# Patient Record
Sex: Male | Born: 1980 | Race: White | Hispanic: No | Smoking: Current every day smoker
Health system: Southern US, Community
[De-identification: ages and names within clinical notes are randomized; demographics above are authoritative.]

## PROBLEM LIST (undated history)

## (undated) DIAGNOSIS — E119 Type 2 diabetes mellitus without complications: Secondary | ICD-10-CM

## (undated) DIAGNOSIS — F32A Depression, unspecified: Secondary | ICD-10-CM

## (undated) DIAGNOSIS — F329 Major depressive disorder, single episode, unspecified: Secondary | ICD-10-CM

## (undated) DIAGNOSIS — E785 Hyperlipidemia, unspecified: Secondary | ICD-10-CM

## (undated) HISTORY — DX: Depression, unspecified: F32.A

## (undated) HISTORY — DX: Major depressive disorder, single episode, unspecified: F32.9

---

## 2005-08-17 ENCOUNTER — Emergency Department: Payer: Self-pay | Admitting: Unknown Physician Specialty

## 2011-07-20 ENCOUNTER — Emergency Department: Payer: Self-pay | Admitting: Emergency Medicine

## 2012-10-11 IMAGING — CR DG CHEST 2V
1 series · 2 of 2 positions shown · non-contrast
Comparison: none

REASON FOR EXAM: chest pain
COMMENTS:   May transport without cardiac monitor

PROCEDURE:     DXR - DXR CHEST PA (OR AP) AND LATERAL  - July 20, 2011  [DATE]
RESULT:     The lung fields are clear. No pneumonia, pneumothorax or pleural
effusion is seen. The heart, mediastinal and osseous structures show no
acute changes.

[Series 1: w chest pa · 0.14mm/px · 2 of 2 slices shown]
[im 1/2]
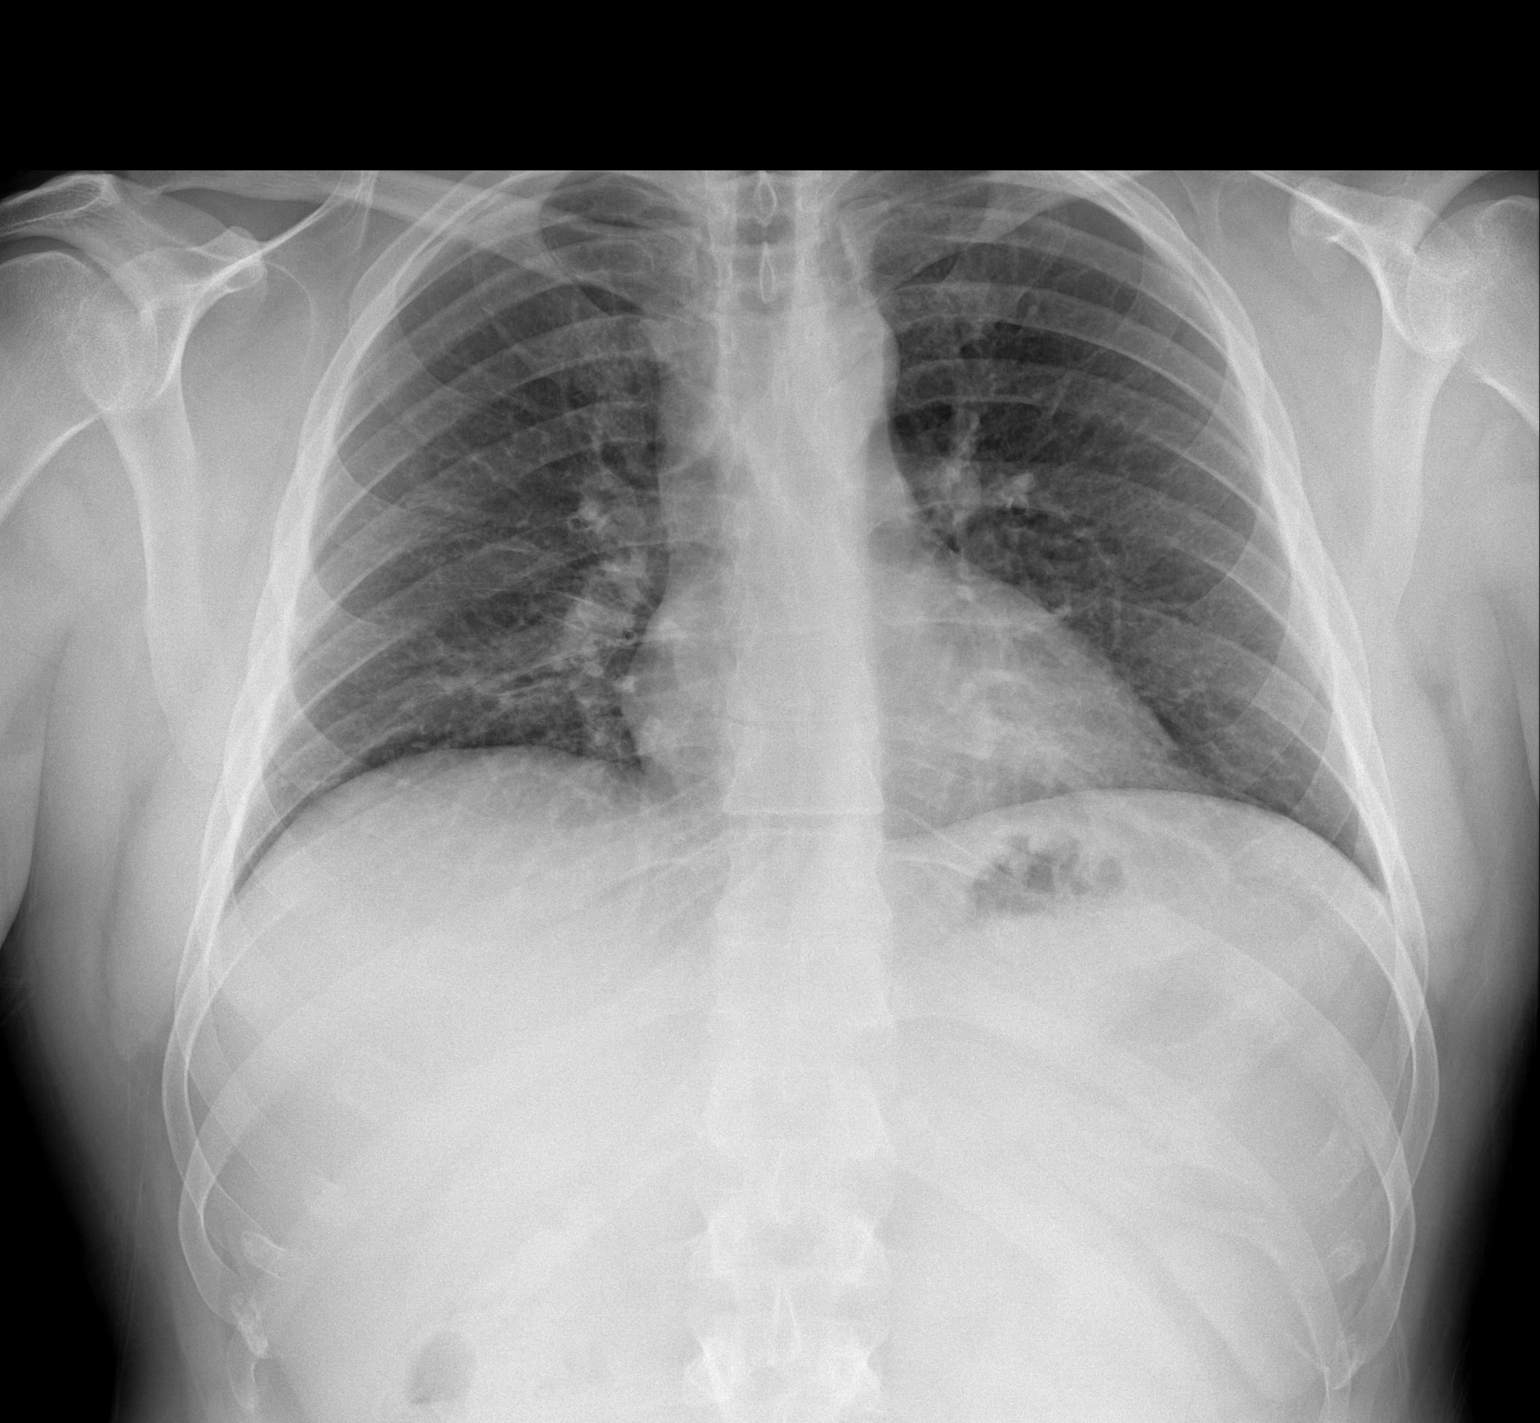
[im 2/2]
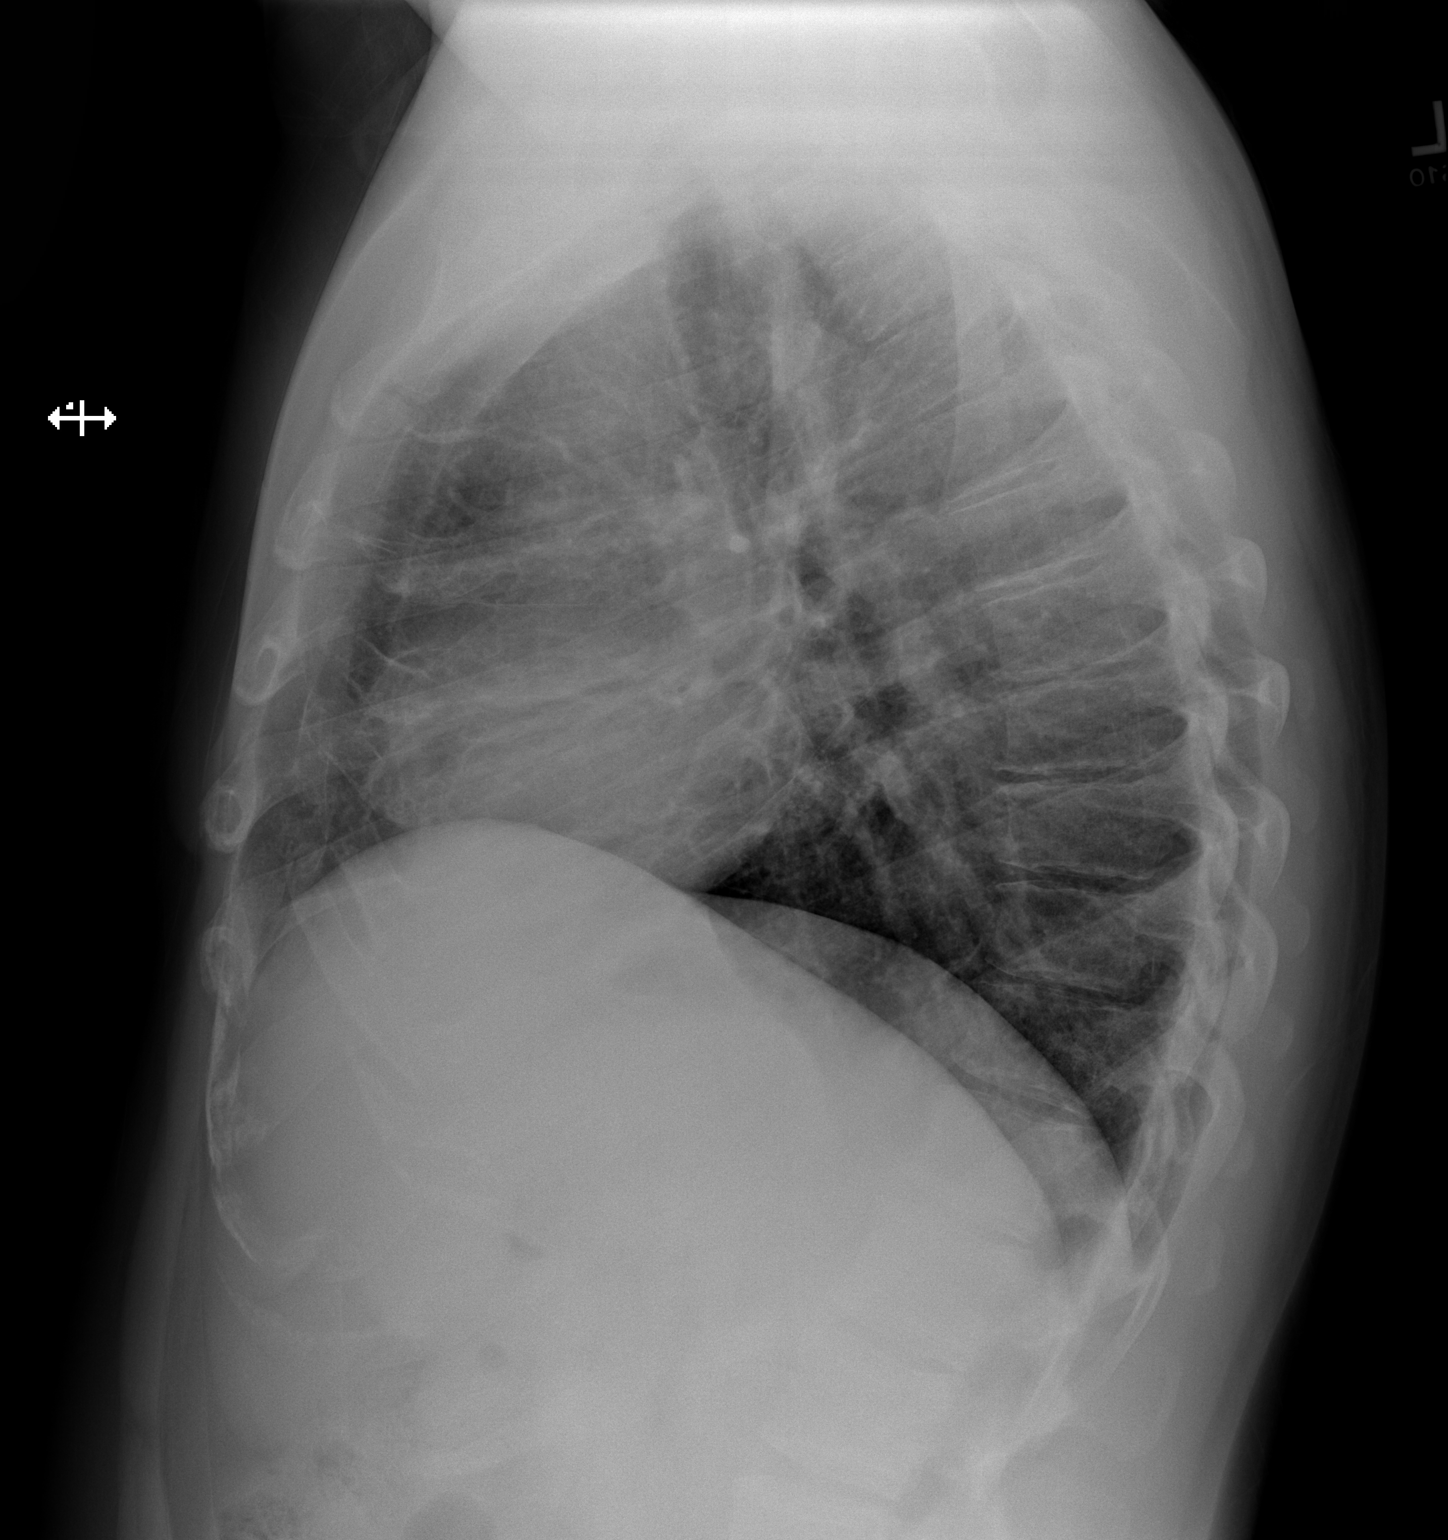

[2 of 2 positions shown; findings below may reference images not displayed]

IMPRESSION: 1.     No acute changes are identified.

## 2012-10-11 IMAGING — CT CT CHEST W/ CM
1 of 2 series · 15 of 32 positions shown, 20 images · IV contrast (isovue)
Comparison: none

REASON FOR EXAM: query PE
COMMENTS:

PROCEDURE:     CT  - CT CHEST (FOR PE) W  - July 20, 2011  [DATE]
RESULT:     Chest CT dated 07/20/2011.
TECHNIQUE: Helical 3 mm sections were obtained from the thoracic inlet
through the lung bases status post intravenous administration of 75 mL of
Isovue-HVM.

[Series 4: soft tissue · axial · 0.69mm/px · z∈[+208,+464]mm · 15 of 97 slices shown, 20 images]
[im 6/97  soft-tissue]
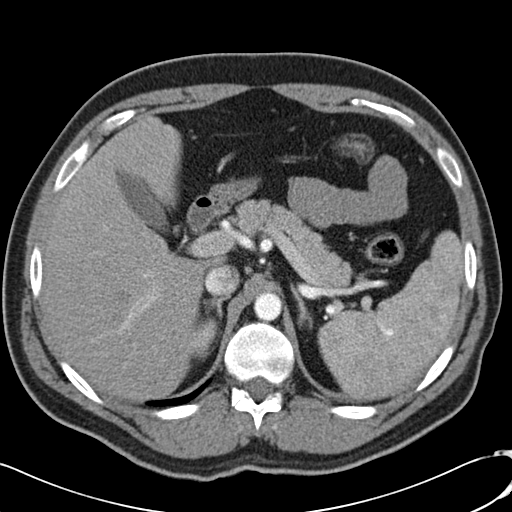
[im 6/97  bone]
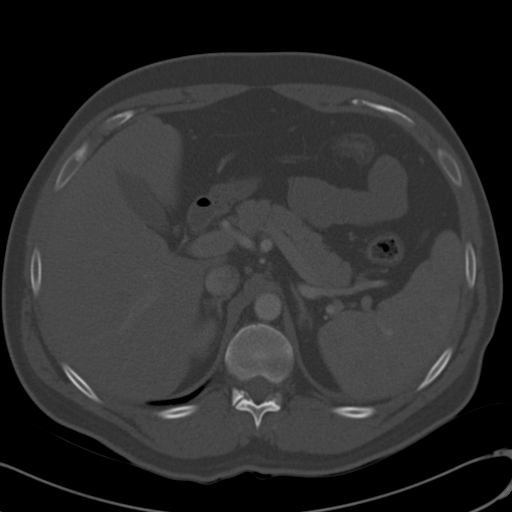
[im 11/97  soft-tissue]
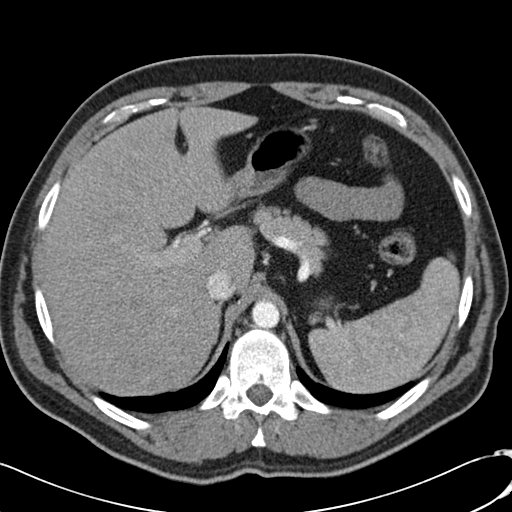
[im 21/97  soft-tissue]
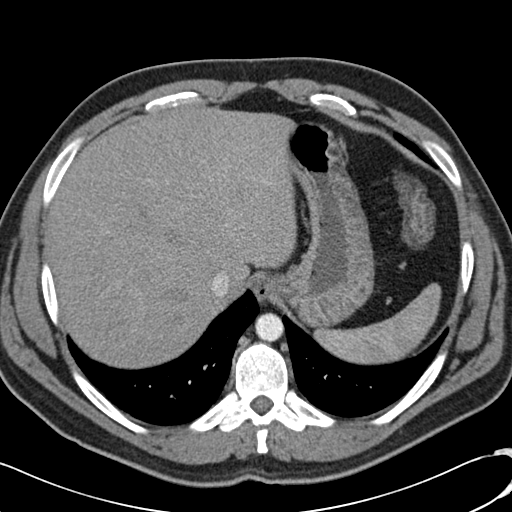
[im 26/97  soft-tissue]
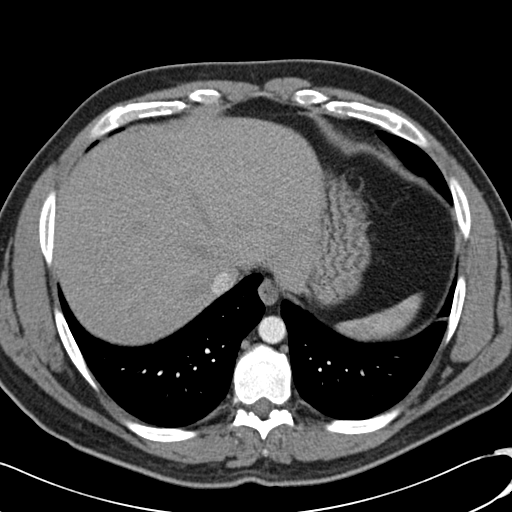
[im 31/97  soft-tissue]
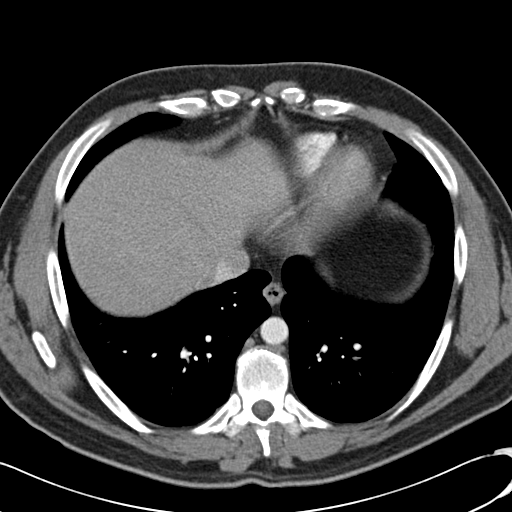
[im 41/97  soft-tissue]
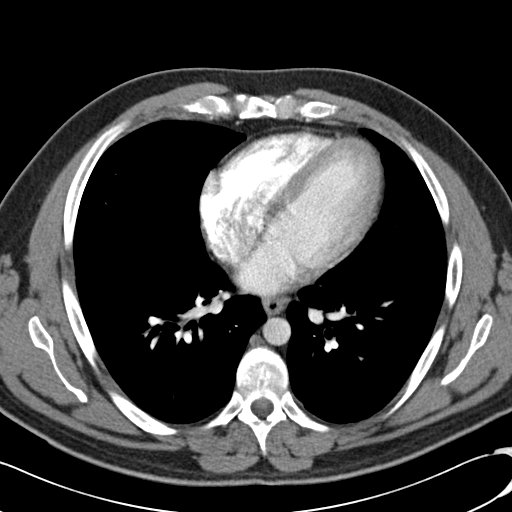
[im 46/97  soft-tissue]
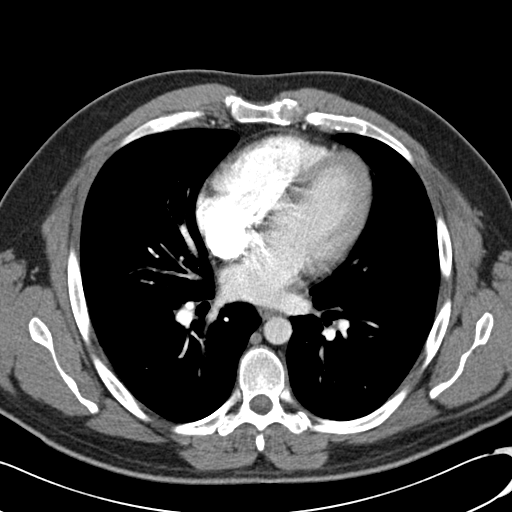
[im 51/97  soft-tissue]
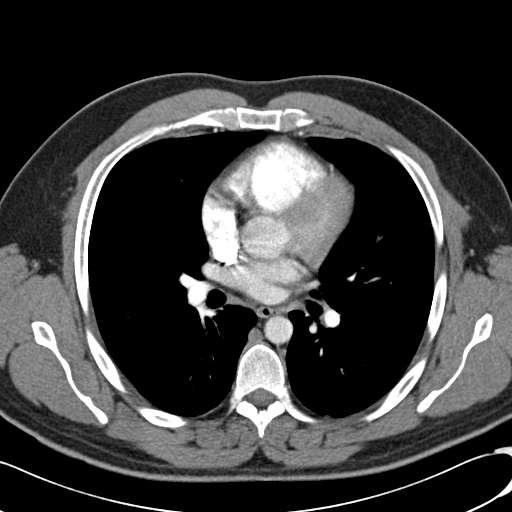
[im 56/97  soft-tissue]
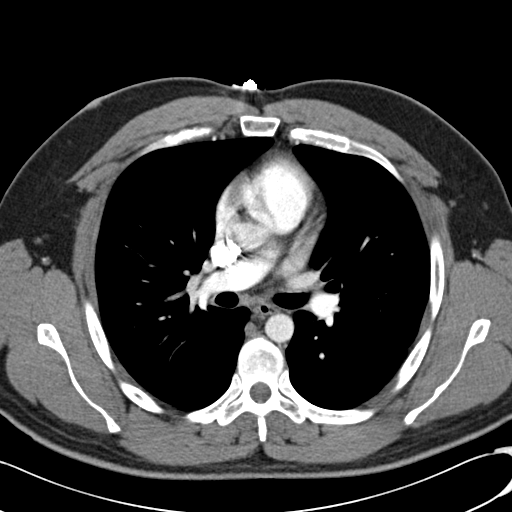
[im 56/97  bone]
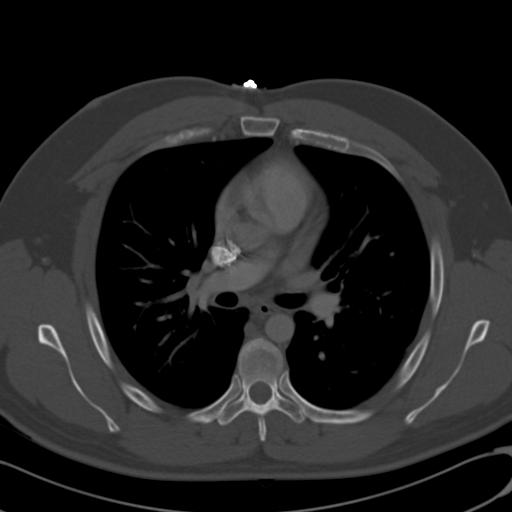
[im 66/97  soft-tissue]
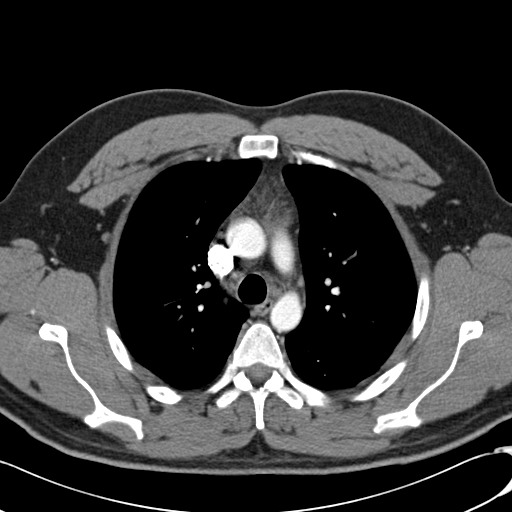
[im 71/97  soft-tissue]
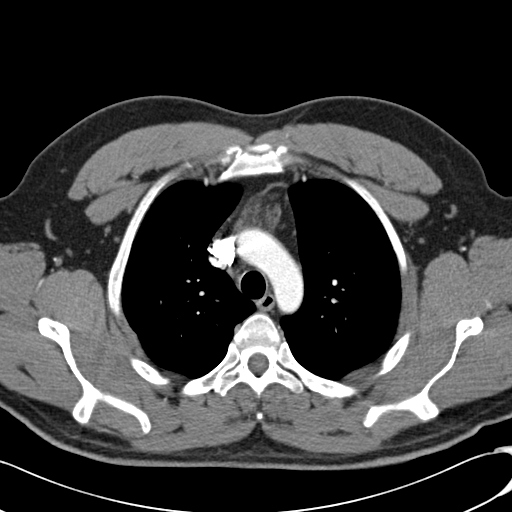
[im 76/97  soft-tissue]
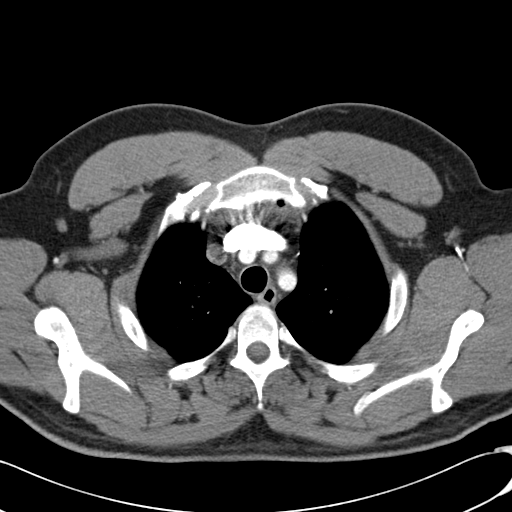
[im 76/97  lung]
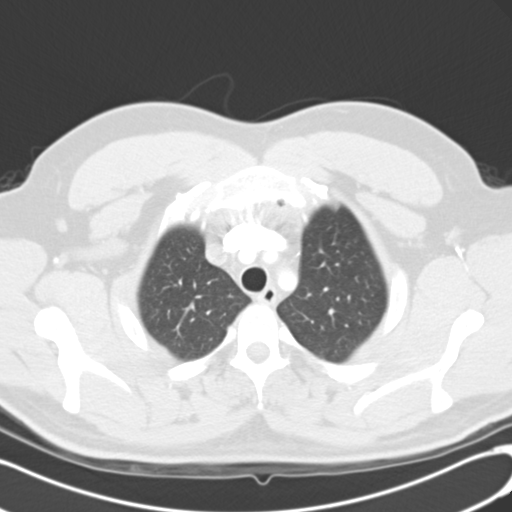
[im 81/97  lung]
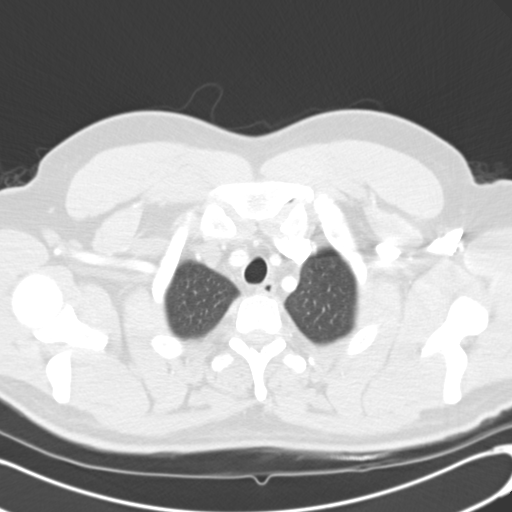
[im 86/97  soft-tissue]
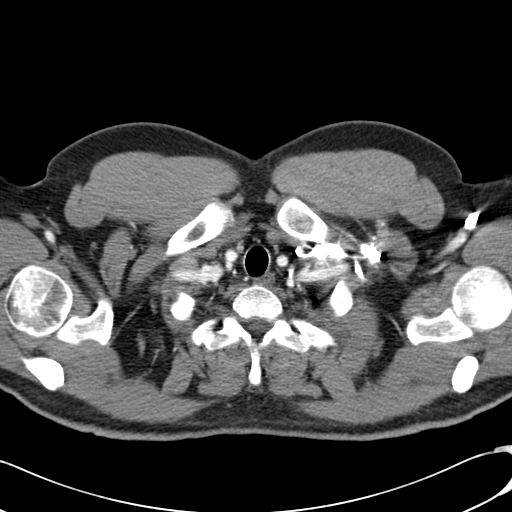
[im 86/97  lung]
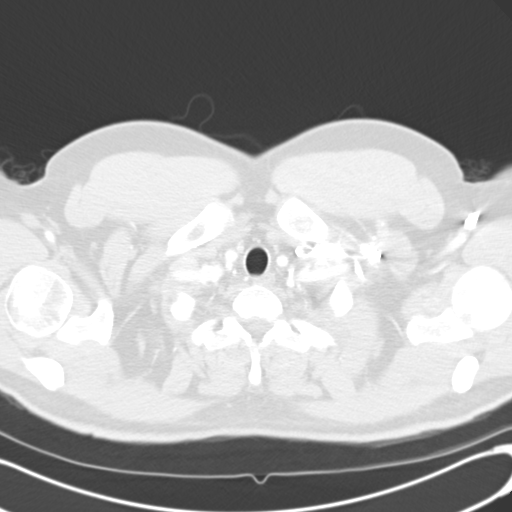
[im 91/97  soft-tissue]
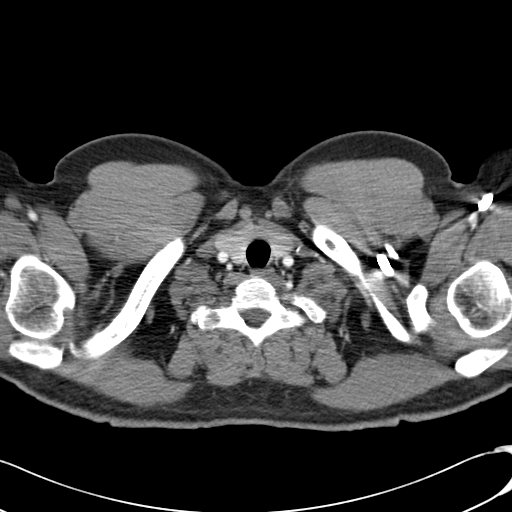
[im 91/97  lung]
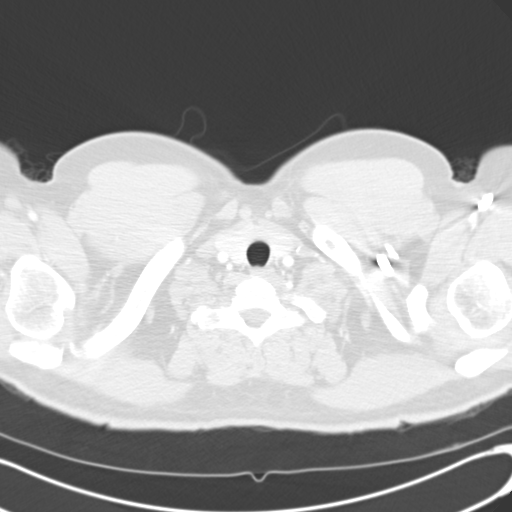

[15 of 32 positions shown; findings below may reference images not displayed]

FINDINGS: Mediastinum and hilar regions and structures demonstrate no
evidence of adenopathy nor masses. There is no evidence of a filling defect
within the main, lobar, or segmental pulmonary arteries. The lung parenchyma
demonstrate no evidence of focal infiltrates, effusions, edema, masses, nor
nodules. The visualized upper viscera are unremarkable.
IMPRESSION: No CT evidence of pulmonary to embolic disease.
2. The remaining findings as described above.

## 2013-09-06 HISTORY — PX: VASECTOMY: SHX75

## 2014-04-12 ENCOUNTER — Ambulatory Visit (INDEPENDENT_AMBULATORY_CARE_PROVIDER_SITE_OTHER): Payer: BC Managed Care – PPO | Admitting: Adult Health

## 2014-04-12 ENCOUNTER — Encounter: Payer: Self-pay | Admitting: Adult Health

## 2014-04-12 ENCOUNTER — Encounter (INDEPENDENT_AMBULATORY_CARE_PROVIDER_SITE_OTHER): Payer: Self-pay

## 2014-04-12 VITALS — BP 114/75 | HR 84 | Temp 98.2°F | Resp 14 | Ht 69.75 in | Wt 185.0 lb

## 2014-04-12 DIAGNOSIS — Z7189 Other specified counseling: Secondary | ICD-10-CM

## 2014-04-12 DIAGNOSIS — Z716 Tobacco abuse counseling: Secondary | ICD-10-CM | POA: Insufficient documentation

## 2014-04-12 DIAGNOSIS — F17200 Nicotine dependence, unspecified, uncomplicated: Secondary | ICD-10-CM

## 2014-04-12 DIAGNOSIS — F411 Generalized anxiety disorder: Secondary | ICD-10-CM

## 2014-04-12 MED ORDER — SERTRALINE HCL 25 MG PO TABS: 25.0000 mg | ORAL_TABLET | Freq: Every day | ORAL | Status: DC

## 2014-04-12 NOTE — Progress Notes (Signed)
Pre visit review using our clinic review tool, if applicable. No additional management support is needed unless otherwise documented below in the visit note. 

## 2014-04-12 NOTE — Progress Notes (Signed)
Patient ID: Tony Lynn, male   DOB: [birth year], 33 y.o.   MRN: [id number]    Subjective:    Patient ID: Tony Lynn, male    DOB: [birth year], 33 y.o.   MRN: [id number]  HPI  Pt is a pleasant 33 y/o male who presents to clinic to establish care. He reports history of feeling depressed during his divorce 1.5 years ago. No problems of depression otherwise. He reports that he worries all the time. Worries about things he cannot control and about the future. Would like to try some medication that may help with this ongoing "anxiety" about life. Has tried xanax and did not like the way it made him feel.  He is a smoker since age 33. Smokes ~ 1 ppd. He has tried Chantix but it made him have terrible dreams so he stopped taking it. He would like to quit.   Past Medical History  Diagnosis Date  . Depression     Situational - divorce    Past Surgical History  Procedure Laterality Date  . Vasectomy  Jan. 2015    Family History  Problem Relation Age of Onset  . Arthritis Mother   . Lupus Mother   . Depression Mother     anxiety/depression  . Heart disease Mother   . Hypertension Father   . Arthritis Maternal Grandfather   . Cancer Maternal Grandfather   . Diabetes Paternal Grandfather     History   Social History  . Marital Status: Divorced    Spouse Name: N/A    Number of Children: 2  . Years of Education: N/A   Occupational History  . [employer]    Social History Main Topics  . Smoking status: Current Every Day Smoker -- 1.00 packs/day for 15 years    Types: Cigarettes  . Smokeless tobacco: Never Used  . Alcohol Use: Yes     Comment: 6-8 drinks weekly  . Drug Use: No  . Sexual Activity: Not on file   Other Topics Concern  . Not on file   Social History Narrative  . No narrative on file     Review of Systems  Constitutional: Negative.   HENT: Negative.   Eyes: Negative.   Respiratory: Negative.   Cardiovascular: Negative.     Gastrointestinal: Negative.   Endocrine: Negative.   Genitourinary: Negative.   Musculoskeletal: Negative.   Skin: Negative.   Allergic/Immunologic: Negative.   Neurological: Negative.   Hematological: Negative.   Psychiatric/Behavioral: Negative.        Objective:  There were no vitals taken for this visit.   Physical Exam  Constitutional: He is oriented to person, place, and time. He appears well-developed and well-nourished. No distress.  HENT:  Head: Normocephalic and atraumatic.  Eyes: Conjunctivae and EOM are normal.  Neck: Neck supple.  Cardiovascular: Normal rate and regular rhythm.   Pulmonary/Chest: Effort normal. No respiratory distress.  Musculoskeletal: Normal range of motion.  Neurological: He is alert and oriented to person, place, and time. Coordination normal.  Skin: Skin is warm and dry.  Psychiatric: He has a normal mood and affect. His behavior is normal. Judgment and thought content normal.       Assessment & Plan:   1. Generalized anxiety disorder Start zoloft 25 mg daily. Discussed side effects of medication. He will return for follow up in 1 month.  2. Tobacco abuse counseling Encouraged him to quit. He is going to try the nicotine patches.

## 2014-04-12 NOTE — Patient Instructions (Signed)
  Start Zoloft 25 mg daily.    Return for a follow up appt in 1 month.

## 2014-04-15 ENCOUNTER — Telehealth: Payer: Self-pay | Admitting: Adult Health

## 2014-04-15 NOTE — Telephone Encounter (Signed)
Relevant patient education assigned to patient using Emmi. ° °

## 2014-06-18 ENCOUNTER — Encounter: Payer: Self-pay | Admitting: Internal Medicine

## 2014-06-18 ENCOUNTER — Ambulatory Visit (INDEPENDENT_AMBULATORY_CARE_PROVIDER_SITE_OTHER): Payer: BC Managed Care – PPO | Admitting: Internal Medicine

## 2014-06-18 VITALS — BP 122/70 | HR 85 | Temp 98.0°F | Wt 190.8 lb

## 2014-06-18 DIAGNOSIS — G5621 Lesion of ulnar nerve, right upper limb: Secondary | ICD-10-CM

## 2014-06-18 DIAGNOSIS — M7121 Synovial cyst of popliteal space [Baker], right knee: Secondary | ICD-10-CM

## 2014-06-18 NOTE — Patient Instructions (Signed)
Baker Cyst °A Baker cyst is a sac-like structure that forms in the back of the knee. It is filled with the same fluid that is located in your knee. This fluid lubricates the bones and cartilage of the knee and allows them to move over each other more easily. °CAUSES  °When the knee becomes injured or inflamed, increased fluid forms in the knee. When this happens, the joint lining is pushed out behind the knee and forms the Baker cyst. This cyst may also be caused by inflammation from arthritic conditions and infections. °SIGNS AND SYMPTOMS  °A Baker cyst usually has no symptoms. When the cyst is substantially enlarged: °· You may feel pressure behind the knee, stiffness in the knee, or a mass in the area behind the knee. °· You may develop pain, redness, and swelling in the calf.  This can suggest a blood clot and requires evaluation by your health care provider. °DIAGNOSIS  °A Baker cyst is most often found during an ultrasound exam. This exam may have been performed for other reasons, and the cyst was found incidentally. Sometimes an MRI is used. This picks up other problems within a joint that an ultrasound exam may not. If the Baker cyst developed immediately after an injury, X-ray exams may be used to diagnose the cyst. °TREATMENT  °The treatment depends on the cause of the cyst. Anti-inflammatory medicines and rest often will be prescribed. If the cyst is caused by a bacterial infection, antibiotic medicines may be prescribed.  °HOME CARE INSTRUCTIONS  °· If the cyst was caused by an injury, for the first 24 hours, keep the injured leg elevated on 2 pillows while lying down. °· For the first 24 hours while you are awake, apply ice to the injured area: °¨ Put ice in a plastic bag. °¨ Place a towel between your skin and the bag. °¨ Leave the ice on for 20 minutes, 2-3 times a day. °· Only take over-the-counter or prescription medicines for pain, discomfort, or fever as directed by your health care  provider. °· Only take antibiotic medicine as directed. Make sure to finish it even if you start to feel better. °MAKE SURE YOU:  °· Understand these instructions. °· Will watch your condition. °· Will get help right away if you are not doing well or get worse. °Document Released: 08/23/2005 Document Revised: 06/13/2013 Document Reviewed: 04/04/2013 °ExitCare® Patient Information ©2015 [employer]. This information is not intended to replace advice given to you by your health care provider. Make sure you discuss any questions you have with your health care provider. ° °

## 2014-06-18 NOTE — Progress Notes (Signed)
   Subjective:    Patient ID: Tony Lynn, male    DOB: May 13, 1981, 33 y.o.   MRN: [id number]  HPI  Pt presents to the clinic today with c/o  "knot on the back of my right knee". He noticed this 1 month ago. He reports that he does feel sharp pains in that area. He denies any specific injury to the area. He has not tried anything OTC.  Review of Systems      Past Medical History  Diagnosis Date  . Depression     Situational - divorce    Current Outpatient Prescriptions  Medication Sig Dispense Refill  . sertraline (ZOLOFT) 25 MG tablet Take 1 tablet (25 mg total) by mouth daily.  30 tablet  5   No current facility-administered medications for this visit.    No Known Allergies  Family History  Problem Relation Age of Onset  . Arthritis Mother   . Lupus Mother   . Depression Mother     anxiety/depression  . Heart disease Mother   . Hypertension Father   . Arthritis Maternal Grandfather   . Cancer Maternal Grandfather   . Diabetes Paternal Grandfather     History   Social History  . Marital Status: Divorced    Spouse Name: N/A    Number of Children: 2  . Years of Education: N/A   Occupational History  . [employer]    Social History Main Topics  . Smoking status: Current Every Day Smoker -- 1.00 packs/day for 15 years    Types: Cigarettes  . Smokeless tobacco: Never Used  . Alcohol Use: Yes     Comment: 6-8 drinks weekly  . Drug Use: No  . Sexual Activity: Not on file   Other Topics Concern  . Not on file   Social History Narrative  . No narrative on file     Constitutional: Denies fever, malaise, fatigue, headache or abrupt weight changes.  Musculoskeletal: Pt reports a "knot" on the back of right knee. Denies decrease in range of motion, difficulty with gait, muscle pain or joint pain and swelling.  Skin: Denies redness, rashes, lesions or ulcercations.    No other specific complaints in a complete review of systems (except as listed  in HPI above).  Objective:   Physical Exam  BP 122/70  Pulse 85  Temp(Src) 98 F (36.7 C) (Oral)  Wt 190 lb 12 oz (86.524 kg)  SpO2 97% Wt Readings from Last 3 Encounters:  06/18/14 190 lb 12 oz (86.524 kg)  04/12/14 185 lb (83.915 kg)    General: Appears his stated age, well developed, well nourished in NAD. Skin: Warm, dry and intact. No rashes, lesions or ulcerations noted. Cardiovascular: Normal rate and rhythm. S1,S2 noted.  No murmur, rubs or gallops noted. Pulmonary/Chest: Normal effort and positive vesicular breath sounds. No respiratory distress. No wheezes, rales or ronchi noted. . Musculoskeletal: Normal flexion and extension of the knee. Small < 2 cm palpable mass noted in the right popliteal region. No difficulty with gait.        Assessment & Plan:   Popliteal mass:  ? Bakers cyst but not positive based on exam Will check ultrasound  Will call you with results/treatment plan based off ultrasound  RTC as needed

## 2014-06-18 NOTE — Addendum Note (Signed)
Addended by: Lorre MunroeBAITY, REGINA W on: 06/18/2014 02:42 PM   Modules accepted: Orders

## 2014-06-18 NOTE — Progress Notes (Signed)
Pre visit review using our clinic review tool, if applicable. No additional management support is needed unless otherwise documented below in the visit note. 

## 2014-06-19 ENCOUNTER — Ambulatory Visit: Payer: Self-pay | Admitting: Internal Medicine

## 2014-06-20 ENCOUNTER — Encounter: Payer: Self-pay | Admitting: Internal Medicine

## 2014-06-24 ENCOUNTER — Telehealth: Payer: Self-pay | Admitting: Family Medicine

## 2014-06-24 NOTE — Telephone Encounter (Addendum)
I gave results of x-ray to patient. Tony KocherRegina mentioned in her note she spoke to Dr.Copland about patient's report.  Tony KocherRegina is off this week and patient said he would like to be referred to a surgeon in [place] for Baker's Cyst.  Dr.Copland will you refer patient since Tony KocherRegina is off?

## 2014-06-24 NOTE — Telephone Encounter (Signed)
i am not clear that Rene KocherRegina understood our conversation about this problem. Baker's cysts are not usually operatively fixed.   I would typically not refer this to surgery, since at this point I do not know if it is a surgical problem.   Thanks.

## 2014-06-26 ENCOUNTER — Telehealth: Payer: Self-pay | Admitting: Adult Health

## 2014-06-26 NOTE — Telephone Encounter (Signed)
° °   Small Medium Large Extra Extra Large                Ezekiel InaMichael C Sunday Description: 33 year old male  06/24/2014 Telephone Provider: Hannah BeatSpencer Copland, MD  MRN: [id number] [facility]                 Call Documentation     Hannah BeatSpencer Copland, MD at 06/24/2014 1:22 PM     Status: Signed        i am not clear that Rene KocherRegina understood our conversation about this problem. Baker's cysts are not usually operatively fixed.  I would typically not refer this to surgery, since at this point I do not know if it is a surgical problem.  Thanks.        Laury Axonarrie L Efird at 06/24/2014 9:18 AM     Status: Addendum        I gave results of x-ray to patient. Rene KocherRegina mentioned in her note she spoke to Dr.Copland about patient's report. Rene KocherRegina is off this week and patient said he would like to be referred to a surgeon in [place] for Baker's Cyst. Dr.Copland will you refer patient since Rene KocherRegina is off?                                        Called and spoke with patient stated that Nicki Reaperegina Baity, who saw pt for an acute visit was not in the office this week.  After sending the phone note to another provider, he did not feel comfortable referring to a general surgeon as the patient had requested.  Pt stated he believed he was wasting his time with the US if was not going to be treated.

## 2014-06-26 NOTE — Telephone Encounter (Signed)
Spoke with Mr. Tony Lynn.  Appointment scheduled to see Dr. Patsy Lageropland 07/01/2014 @ 9:00am to follow up on his knee/baker's cyst.

## 2014-06-26 NOTE — Telephone Encounter (Signed)
The best course of action might be for him to f/u with me and I can go over ultrasound with him myself.   He really just need to have his knee examined and for someone to figure out what is wrong with his knee. I am happy to see him.

## 2014-06-26 NOTE — Telephone Encounter (Signed)
Mr. Perlie GoldRussell called saying [facility] to have an [facility] last week. He had an US of his leg and was told he has Baker's Cyst. Before his US, Mr. Perlie GoldRussell said [facility] told him they'd give him a steroid shot for pain. Now he's being told they don't feel comfortable doing so. He's wondering if he'd be able to get a steroid shot here, if he needs a follow-up appt after having the US, or what his next step should be. He thinks [facility]. Please call the patient.  Pt ph# [phone] Thank you.

## 2014-07-01 ENCOUNTER — Encounter: Payer: Self-pay | Admitting: Family Medicine

## 2014-07-01 ENCOUNTER — Ambulatory Visit (INDEPENDENT_AMBULATORY_CARE_PROVIDER_SITE_OTHER): Payer: BC Managed Care – PPO | Admitting: Family Medicine

## 2014-07-01 VITALS — BP 100/70 | HR 77 | Temp 98.0°F | Ht 69.75 in | Wt 197.0 lb

## 2014-07-01 DIAGNOSIS — M7121 Synovial cyst of popliteal space [Baker], right knee: Secondary | ICD-10-CM

## 2014-07-01 MED ORDER — METHYLPREDNISOLONE ACETATE 40 MG/ML IJ SUSP
80.0000 mg | Freq: Once | INTRAMUSCULAR | Status: AC
  Administered 2014-07-01: 80 mg via INTRA_ARTICULAR

## 2014-07-01 NOTE — Progress Notes (Signed)
Dr. Karleen HampshireSpencer T. Melitza Metheny, MD, CAQ Sports Medicine Primary [facility] and Sports Medicine [address] [place], [id number] Phone: [phone] Fax: [phone]  07/01/2014  Patient: Tony Lynn, MRN: [id number], DOB: 05/31/1981, 33 y.o.  Primary Physician:  Rey,Raquel, NP  Chief Complaint: Follow-up  Subjective:   Tony InaMichael C Adan is a 33 y.o. very pleasant male patient who presents with the following:  Very pleasant young man who works in the [employer] business who presents with right-sided knee pain medially as well as a pain and fullness in the posterior aspect of his right medial knee. He saw Ms. Baity couple of weeks ago, and was found to have a Baker cyst on ultrasound. He did not have any particular injury that he can recall. He has tried some basic anti-inflammatories and Tylenol without much significant relief.  No known injury. Front it will pop. Last week felt like a growing pain.  Bother's at work some, dull pain.   Past Medical History, Surgical History, Social History, Family History, Problem List, Medications, and Allergies have been reviewed and updated if relevant.  GEN: No fevers, chills. Nontoxic. Primarily MSK c/o today. MSK: Detailed in the HPI GI: tolerating PO intake without difficulty Neuro: No numbness, parasthesias, or tingling associated. Otherwise the pertinent positives of the ROS are noted above.   Objective:   BP 100/70  Pulse 77  Temp(Src) 98 F (36.7 C) (Oral)  Ht 5' 9.75" (1.772 m)  Wt 197 lb (89.359 kg)  BMI 28.46 kg/m2   GEN: WDWN, NAD, Non-toxic, Alert & Oriented x 3 HEENT: Atraumatic, Normocephalic.  Ears and Nose: No external deformity. EXTR: No clubbing/cyanosis/edema NEURO: Normal gait.  PSYCH: Normally interactive. Conversant. Not depressed or anxious appearing.  Calm demeanor.   Knee:  R Gait: Normal heel toe pattern ROM: 0-130 Effusion: neg BAKER"S CYST ON THE R Echymosis or edema: none Patellar tendon NT Painful  PLICA: neg Patellar grind: negative Medial and lateral patellar facet loading: negative medial and lateral joint lines: posterior medial joint line tenderness Mcmurray's neg Flexion-pinch neg Varus and valgus stress: stable Lachman: neg Ant and Post drawer: neg Hip abduction, IR, ER: WNL Hip flexion str: 5/5 Hip abd: 5/5 Quad: 5/5 VMO atrophy:No Hamstring concentric and eccentric: 5/5   Radiology: No results found.  Assessment and Plan:   Baker's cyst, right - Plan: methylPREDNISolone acetate (DEPO-MEDROL) injection 80 mg  The patient has posterior medial joint line tenderness, which would be associated with the potential meniscal tear. He is not having any mechanical symptoms right now, so we are going to try to treat this conservatively. This is often associated with a Baker cyst. Certainly, he could have a Baker cyst from other things that may have caused some mild effusion in his knee given his occupation.  There is a reasonable chance that an intra-articular injection with corticosteroid will shrink the Baker's cyst also.  Knee Injection, RIGHT Patient verbally consented to procedure. Risks (including potential rare risk of infection), benefits, and alternatives explained. Sterilely prepped with Chloraprep. Ethyl cholride used for anesthesia. 8 cc Lidocaine 1% mixed with Depo-Medrol 80 mg injected using the anteromedial approach without difficulty. No complications with procedure and tolerated well. Patient had decreased pain post-injection.   Follow-up: in December if not improved  Signed,  Jazmene Racz T. Farrel Guimond, MD   Patient's Medications  New Prescriptions   No medications on file  Previous Medications   SERTRALINE (ZOLOFT) 25 MG TABLET    Take 1 tablet (25 mg total)  by mouth daily.  Modified Medications   No medications on file  Discontinued Medications   No medications on file

## 2014-07-01 NOTE — Progress Notes (Signed)
Pre visit review using our clinic review tool, if applicable. No additional management support is needed unless otherwise documented below in the visit note. 

## 2014-07-10 ENCOUNTER — Ambulatory Visit: Payer: BC Managed Care – PPO | Admitting: Internal Medicine

## 2014-07-10 DIAGNOSIS — Z0289 Encounter for other administrative examinations: Secondary | ICD-10-CM

## 2017-06-09 ENCOUNTER — Ambulatory Visit: Payer: Self-pay | Admitting: Family

## 2023-12-12 ENCOUNTER — Ambulatory Visit (INDEPENDENT_AMBULATORY_CARE_PROVIDER_SITE_OTHER): Payer: Self-pay | Admitting: Family Medicine

## 2023-12-12 ENCOUNTER — Encounter: Payer: Self-pay | Admitting: Family Medicine

## 2023-12-12 VITALS — BP 134/92 | HR 95 | Ht 70.0 in | Wt 238.0 lb

## 2023-12-12 DIAGNOSIS — E6609 Other obesity due to excess calories: Secondary | ICD-10-CM | POA: Insufficient documentation

## 2023-12-12 DIAGNOSIS — F17200 Nicotine dependence, unspecified, uncomplicated: Secondary | ICD-10-CM

## 2023-12-12 DIAGNOSIS — Z130 Encounter for screening for diseases of the blood and blood-forming organs and certain disorders involving the immune mechanism: Secondary | ICD-10-CM

## 2023-12-12 DIAGNOSIS — S6991XA Unspecified injury of right wrist, hand and finger(s), initial encounter: Secondary | ICD-10-CM | POA: Diagnosis not present

## 2023-12-12 DIAGNOSIS — R030 Elevated blood-pressure reading, without diagnosis of hypertension: Secondary | ICD-10-CM

## 2023-12-12 DIAGNOSIS — E66811 Obesity, class 1: Secondary | ICD-10-CM | POA: Diagnosis not present

## 2023-12-12 DIAGNOSIS — Z6834 Body mass index (BMI) 34.0-34.9, adult: Secondary | ICD-10-CM

## 2023-12-12 DIAGNOSIS — F419 Anxiety disorder, unspecified: Secondary | ICD-10-CM | POA: Diagnosis not present

## 2023-12-12 DIAGNOSIS — Z1159 Encounter for screening for other viral diseases: Secondary | ICD-10-CM

## 2023-12-12 DIAGNOSIS — Z114 Encounter for screening for human immunodeficiency virus [HIV]: Secondary | ICD-10-CM

## 2023-12-12 MED ORDER — NICOTINE 14 MG/24HR TD PT24: 14.0000 mg | MEDICATED_PATCH | Freq: Every day | TRANSDERMAL | 6 refills | Status: DC

## 2023-12-12 NOTE — Progress Notes (Signed)
 New patient visit   Patient: Tony Lynn   DOB: Oct 14, 1980   43 y.o. Male  MRN: [id number] Visit Date: 12/12/2023  Today's healthcare provider: Ronnald Ramp, MD   Chief Complaint  Patient presents with   Establish Care    R hand possibly broken, he think he broke it about 15 years ago, never had it evaluated about 2 year he was punching a punching bag and re injured the hand, pain level about a 5 and its starting to bother his pointer finger     Subjective    Tony Lynn is a 43 y.o. male who presents today as a new patient to establish care.   HPI     Establish Care    Additional comments: R hand possibly broken, he think he broke it about 15 years ago, never had it evaluated about 2 year he was punching a punching bag and re injured the hand, pain level about a 5 and its starting to bother his pointer finger        Last edited by Ronnald Ramp, MD on 12/12/2023 10:07 AM.       Discussed the use of AI scribe software for clinical note transcription with the patient, who gave verbal consent to proceed.  History of Present Illness Tony Lynn is a 43 year old male who presents to establish care as a new patient.  Approximately 15 years ago, he possibly fractured his right hand, which was never evaluated. Recently, he reinjured the same hand while punching a punching bag. He experiences pain rated at 5 out of 10, affecting his pointer finger, with a knot and swelling that occurs with use, such as when using a nail gun or drill. The pain and swelling radiate down his finger.  He has a history of situational depression and anxiety related to personal stressors, such as divorce and parenting challenges. He previously tried Zoloft for a few days but discontinued it due to adverse effects like vivid dreams and feeling worse. He scores a 6 on the generalized anxiety scale and has a low PHQ-9 score for depression. He prefers not to take medication  or see a therapist at this time. He reports occasional sleep disturbances related to anxiety.  He has been smoking cigarettes for at least 15 years, currently smoking about a pack and a half to two packs a day. He has tried to cut back recently and is interested in using nicotine patches to aid in smoking cessation. He has previously tried Chantix and nicotine gum but experienced adverse effects.  He experiences occasional discomfort after eating, describing a sensation of food sitting in his chest without burning or pain. He has taken omeprazole 20 mg as needed, which he finds helpful. No dysphagia is reported.  His blood pressure was noted to be elevated at 134/92, though he states it is usually around 120/70. He denies any significant past medical history aside from pneumonia around Christmas and a COVID-19 infection when it first emerged.       12/12/2023   10:01 AM  GAD 7 : Generalized Anxiety Score  Nervous, Anxious, on Edge 1  Control/stop worrying 2  Worry too much - different things 2  Trouble relaxing 1  Restless 0  Easily annoyed or irritable 0  Afraid - awful might happen 0  Total GAD 7 Score 6  Anxiety Difficulty Not difficult at all    [facility] Office Visit from 12/12/2023 in [facility]  PHQ-9 Total Score 3        Past Medical History:  Diagnosis Date   Depression    Situational - divorce    Outpatient Medications Prior to Visit  Medication Sig   omeprazole (PRILOSEC OTC) 20 MG tablet Take 20 mg by mouth daily.   [DISCONTINUED] sertraline (ZOLOFT) 25 MG tablet Take 1 tablet (25 mg total) by mouth daily. (Patient not taking: Reported on 12/12/2023)   No facility-administered medications prior to visit.    Past Surgical History:  Procedure Laterality Date   VASECTOMY  Jan. 2015   Family Status  Relation Name Status   Mother  Alive   Father  Alive   Sister  Alive   Brother  Alive   MGF  Deceased   PGF  Deceased  No  partnership data on file   Family History  Problem Relation Age of Onset   Arthritis Mother    Lupus Mother    Depression Mother        anxiety/depression   Heart disease Mother    Hypertension Father    Diabetes Father    Arthritis Maternal Grandfather    Cancer Maternal Grandfather    Diabetes Paternal Grandfather    Social History   Socioeconomic History   Marital status: Divorced    Spouse name: Not on file   Number of children: 2   Years of education: Not on file   Highest education level: Not on file  Occupational History   Occupation: Administrator  Tobacco Use   Smoking status: Every Day    Current packs/day: 1.00    Average packs/day: 1 pack/day for 15.0 years (15.0 ttl pk-yrs)    Types: Cigarettes   Smokeless tobacco: Never   Tobacco comments:    Has cut back to 1PPD currently, reports previously up to 2PPD or 1.5PPD   Substance and Sexual Activity   Alcohol use: Yes    Comment: 6-8 drinks weekly   Drug use: No   Sexual activity: Yes  Other Topics Concern   Not on file  Social History Narrative   Not on file   Social Drivers of Health   Financial Resource Strain: Low Risk  (12/12/2023)   Overall Financial Resource Strain (CARDIA)    Difficulty of Paying Living Expenses: Not hard at all  Food Insecurity: No Food Insecurity (12/12/2023)   Hunger Vital Sign    Worried About Running Out of Food in the Last Year: Never true    Ran Out of Food in the Last Year: Never true  Transportation Needs: No Transportation Needs (12/12/2023)   PRAPARE - Administrator, Civil Service (Medical): No    Lack of Transportation (Non-Medical): No  Physical Activity: Not on file  Stress: No Stress Concern Present (12/12/2023)   [employer] of Occupational Health - Occupational Stress Questionnaire    Feeling of Stress : Only a little  Social Connections: Not on file     No Known Allergies   There is no immunization history on file for this  patient.  Health Maintenance  Topic Date Due   Pneumococcal Vaccine 4-23 Years old (1 of 2 - PCV) Never done   HIV Screening  Never done   Hepatitis C Screening  Never done   DTaP/Tdap/Td (1 - Tdap) Never done   COVID-19 Vaccine (1 - 2024-25 season) Never done   INFLUENZA VACCINE  04/06/2024   HPV VACCINES  Aged Out    Patient  Care Team: Ronnald Ramp, MD as PCP - General (Family Medicine) Rey, Richarda Overlie, NP (Nurse Practitioner)  Review of Systems  Last CBC No results found for: "WBC", "HGB", "HCT", "MCV", "MCH", "RDW", "PLT" Last metabolic panel No results found for: "GLUCOSE", "NA", "K", "CL", "CO2", "BUN", "CREATININE", "EGFR", "CALCIUM", "PHOS", "PROT", "ALBUMIN", "LABGLOB", "AGRATIO", "BILITOT", "ALKPHOS", "AST", "ALT", "ANIONGAP" Last lipids No results found for: "CHOL", "HDL", "LDLCALC", "LDLDIRECT", "TRIG", "CHOLHDL" Last hemoglobin A1c No results found for: "HGBA1C" Last thyroid functions No results found for: "TSH", "T3TOTAL", "T4TOTAL", "THYROIDAB" Last vitamin D No results found for: "25OHVITD2", "25OHVITD3", "VD25OH" Last vitamin B12 and Folate No results found for: "VITAMINB12", "FOLATE"      Objective    BP (!) 134/92   Pulse 95   Ht 5\' 10"  (1.778 m)   Wt 238 lb (108 kg)   SpO2 97%   BMI 34.15 kg/m  BP Readings from Last 3 Encounters:  12/12/23 (!) 134/92  07/01/14 100/70  06/18/14 122/70   Wt Readings from Last 3 Encounters:  12/12/23 238 lb (108 kg)  07/01/14 197 lb (89.4 kg)  06/18/14 190 lb 12 oz (86.5 kg)        Depression Screen    12/12/2023   10:01 AM  PHQ 2/9 Scores  PHQ - 2 Score 0  PHQ- 9 Score 3   No results found for any visits on 12/12/23.   Physical Exam General: Alert, no acute distress Cardio: Normal S1 and S2, RRR, no r/m/g Pulm: CTAB, normal work of breathing ABD: soft, abdomen is not distended, there is no tenderness to palpation, normal BS, no hernia visualized on exam today  MSK: right hand with  limited right handed adduction, normal interosseus muscle strength, pulses palpated in bilateral wrists, no obvious deformities noted in right hand today    Assessment & Plan      Problem List Items Addressed This Visit       Other   Tobacco use disorder - Primary   Relevant Medications   nicotine (NICODERM CQ - DOSED IN MG/24 HOURS) 14 mg/24hr patch   Injury of multiple sites of right hand and fingers   Relevant Orders   AMB referral to orthopedics   Class 1 obesity due to excess calories with serious comorbidity and body mass index (BMI) of 34.0 to 34.9 in adult   Relevant Orders   Hemoglobin A1c   CMP14+EGFR   Lipid panel   TSH+T4F+T3Free   Anxiety   Other Visit Diagnoses       Encounter for screening for HIV       Relevant Orders   HIV Antibody (routine testing w rflx)     Need for hepatitis C screening test         Screening for deficiency anemia       Relevant Orders   CBC     Elevated blood pressure reading           Assessment & Plan Right hand pain Chronic right hand pain with a possible scaphoid fracture or arthritis due to improper healing of a fracture 15 years ago, exacerbated by recent trauma from punching a punching bag. Pain level is 5/10, with swelling and limited range of motion in the pointer finger. Referral to a hand specialist is necessary for accurate diagnosis and management. - Refer to orthopedic hand specialist for evaluation  Generalized anxiety Situational anxiety related to personal life stressors, including divorce and parenting challenges. PHQ-9 score is low for depression, but anxiety score  is 6. Prefers not to take medication or see a therapist at this time. Will consider medication if anxiety worsens or affects sleep. - pt declines referral for counseling, declines medication at this time  - Monitor symptoms and consider medication if anxiety worsens or affects sleep  Tobacco use disorder Chronic  Planning stage  Smoking for 15  years, currently 1.5 to 2 packs per day. Previous attempts to quit with Chantix and nicotine gum were unsuccessful due to side effects. Interested in quitting and trying nicotine patches. Informed that nicotine patches may decrease cravings and that smoking while using the patch is not harmful but not recommended. - Prescribe 14 mg nicotine patches for smoking cessation - counseled patient on smoking cessation for 5 minutes  - Discussed use of a non-chemical inhaler for oral fixation  Gastroesophageal reflux disease (GERD) Discomfort and sensation of food sitting in the esophagus without pain or burning, consistent with GERD, possibly exacerbated by weight. Currently taking omeprazole 20 mg as needed. Advised that alcohol consumption can exacerbate symptoms and increase cancer risk, but current intake is within recommended limits. - Continue omeprazole 20 mg daily as needed  Elevated Blood pressure reading  Elevated blood pressure reading of 134/92 mmHg, with a subsequent reading of 124/84 mmHg. Typically reports normal blood pressure readings. No immediate need for medication, but monitoring is advised. Discussed that factors such as high salt intake and lack of sleep can affect blood pressure. - Monitor blood pressure and reassess if elevated readings persist  General Health Maintenance Discussion of vaccinations and screenings appropriate for age and smoking history. Declined vaccinations at this time. Informed about the benefits of pneumonia and tetanus vaccines, and the importance of HIV screening. - Offer pneumonia vaccine due to smoking history - Offer tetanus vaccine if not received in the last 10 years - Offer COVID-19 vaccine - Perform HIV screening - Schedule physical examination in September      Return in about 5 months (around 05/13/2024) for CPE.      Ronnald Ramp, MD  [facility] [phone] (phone) [phone] (fax)  [facility]

## 2023-12-13 ENCOUNTER — Encounter: Payer: Self-pay | Admitting: Family Medicine

## 2023-12-13 LAB — LIPID PANEL
Chol/HDL Ratio: 6.3 ratio — ABNORMAL HIGH (ref 0.0–5.0)
Cholesterol, Total: 285 mg/dL — ABNORMAL HIGH (ref 100–199)
HDL: 45 mg/dL (ref >39)
LDL Chol Calc (NIH): 177 mg/dL — ABNORMAL HIGH (ref 0–99)
Triglycerides: 327 mg/dL — ABNORMAL HIGH (ref 0–149)
VLDL Cholesterol Cal: 63 mg/dL — ABNORMAL HIGH (ref 5–40)

## 2023-12-13 LAB — CMP14+EGFR
ALT: 39 IU/L (ref 0–44)
AST: 22 IU/L (ref 0–40)
Albumin: 4.5 g/dL (ref 4.1–5.1)
Alkaline Phosphatase: 89 IU/L (ref 44–121)
BUN/Creatinine Ratio: 14 (ref 9–20)
BUN: 13 mg/dL (ref 6–24)
Bilirubin Total: 0.8 mg/dL (ref 0.0–1.2)
CO2: 24 mmol/L (ref 20–29)
Calcium: 10.1 mg/dL (ref 8.7–10.2)
Chloride: 103 mmol/L (ref 96–106)
Creatinine, Ser: 0.92 mg/dL (ref 0.76–1.27)
Globulin, Total: 2.9 g/dL (ref 1.5–4.5)
Glucose: 107 mg/dL — ABNORMAL HIGH (ref 70–99)
Potassium: 4.9 mmol/L (ref 3.5–5.2)
Sodium: 141 mmol/L (ref 134–144)
Total Protein: 7.4 g/dL (ref 6.0–8.5)
eGFR: 107 mL/min/{1.73_m2} (ref >59)

## 2023-12-13 LAB — CBC
Hematocrit: 50.5 % (ref 37.5–51.0)
Hemoglobin: 17.3 g/dL (ref 13.0–17.7)
MCH: 32.3 pg (ref 26.6–33.0)
MCHC: 34.3 g/dL (ref 31.5–35.7)
MCV: 94 fL (ref 79–97)
Platelets: 228 10*3/uL (ref 150–450)
RBC: 5.36 x10E6/uL (ref 4.14–5.80)
RDW: 12.4 % (ref 11.6–15.4)
WBC: 12.1 10*3/uL — ABNORMAL HIGH (ref 3.4–10.8)

## 2023-12-13 LAB — HIV ANTIBODY (ROUTINE TESTING W REFLEX): HIV Screen 4th Generation wRfx: NONREACTIVE

## 2023-12-13 LAB — TSH+T4F+T3FREE
Free T4: 1.07 ng/dL (ref 0.82–1.77)
T3, Free: 3.4 pg/mL (ref 2.0–4.4)
TSH: 0.853 u[IU]/mL (ref 0.450–4.500)

## 2023-12-13 LAB — HEMOGLOBIN A1C
Est. average glucose Bld gHb Est-mCnc: 148 mg/dL
Hgb A1c MFr Bld: 6.8 % — ABNORMAL HIGH (ref 4.8–5.6)

## 2023-12-28 ENCOUNTER — Other Ambulatory Visit: Payer: Self-pay

## 2023-12-28 MED ORDER — ROSUVASTATIN CALCIUM 20 MG PO TABS: 20.0000 mg | ORAL_TABLET | Freq: Every day | ORAL | 3 refills | Status: DC

## 2023-12-28 NOTE — Telephone Encounter (Signed)
 Pt states he would like medication called in for his cholesterol  [facility] [address] [place] - [address] [place], [place] [address] Phone: [phone]  Fax: [phone]

## 2024-04-05 ENCOUNTER — Encounter: Payer: Self-pay | Admitting: Family Medicine

## 2024-04-05 ENCOUNTER — Ambulatory Visit (INDEPENDENT_AMBULATORY_CARE_PROVIDER_SITE_OTHER): Admitting: Family Medicine

## 2024-04-05 VITALS — BP 137/82 | HR 108 | Temp 98.4°F | Ht 70.0 in | Wt 232.6 lb

## 2024-04-05 DIAGNOSIS — K625 Hemorrhage of anus and rectum: Secondary | ICD-10-CM | POA: Diagnosis not present

## 2024-04-05 DIAGNOSIS — R1319 Other dysphagia: Secondary | ICD-10-CM

## 2024-04-05 NOTE — Progress Notes (Unsigned)
 Established patient visit   Patient: Tony Lynn   DOB: 12/18/1980   43 y.o. Male  MRN: [id number] Visit Date: 04/05/2024  Today's healthcare provider: Rockie Agent, MD   Chief Complaint  Patient presents with   Acute Visit    - Blood in stool 3 or 4 times within the last few weeks. - Bright red blood when having bowel movement. - No hemorrhoids as he knows of.    Subjective     HPI     Acute Visit    Additional comments: - Blood in stool 3 or 4 times within the last few weeks. - Bright red blood when having bowel movement. - No hemorrhoids as he knows of.       Last edited by Terrel Powell CROME, CMA on 04/05/2024  1:52 PM.       Discussed the use of AI scribe software for clinical note transcription with the patient, who gave verbal consent to proceed.  History of Present Illness Tony Lynn is a 43 year old male who presents with blood in his stool.  He has experienced blood in his stool three or four times over the past three weeks, with the blood described as bright red. The bleeding has occurred twice in one evening and once about a month prior. He describes one instance where the toilet bowl was filled with blood, but there is no associated pain or discomfort during bowel movements. He is not aware of having hemorrhoids.  The bleeding episodes occurred after trips to the beach, which disrupted his normal routine. He describes the stool as feeling 'rough and dry' during one of the episodes and denies any need to strain during bowel movements, except for one particular time. He also reports returning to the bathroom about an hour after the initial episode, with minimal bleeding at that time.  He has a family history of colon cancer and polyps, as his grandfather had these conditions. He is concerned about this family history.  No rectal pain, trouble sitting, or abdominal discomfort. He uses Prilosec occasionally, about once or twice a  week, which helps alleviate a sensation of food sitting in his chest after eating.     Past Medical History:  Diagnosis Date   Depression    Situational - divorce    Medications: Outpatient Medications Prior to Visit  Medication Sig   nicotine  (NICODERM CQ  - DOSED IN MG/24 HOURS) 14 mg/24hr patch Place 1 patch (14 mg total) onto the skin daily.   omeprazole (PRILOSEC OTC) 20 MG tablet Take 20 mg by mouth daily.   rosuvastatin  (CRESTOR ) 20 MG tablet Take 1 tablet (20 mg total) by mouth daily.   No facility-administered medications prior to visit.    Review of Systems  Last CBC Lab Results  Component Value Date   WBC 12.1 (H) 12/12/2023   HGB 17.3 12/12/2023   HCT 50.5 12/12/2023   MCV 94 12/12/2023   MCH 32.3 12/12/2023   RDW 12.4 12/12/2023   PLT 228 12/12/2023   Last metabolic panel Lab Results  Component Value Date   GLUCOSE 107 (H) 12/12/2023   NA 141 12/12/2023   K 4.9 12/12/2023   CL 103 12/12/2023   CO2 24 12/12/2023   BUN 13 12/12/2023   CREATININE 0.92 12/12/2023   EGFR 107 12/12/2023   CALCIUM  10.1 12/12/2023   PROT 7.4 12/12/2023   ALBUMIN 4.5 12/12/2023   LABGLOB 2.9 12/12/2023   BILITOT 0.8 12/12/2023  ALKPHOS 89 12/12/2023   AST 22 12/12/2023   ALT 39 12/12/2023        Objective    BP 137/82 (BP Location: Left Arm, Cuff Size: Large)   Pulse (!) 108   Temp 98.4 F (36.9 C) (Oral)   Ht 5' 10 (1.778 m)   Wt 232 lb 9.6 oz (105.5 kg)   SpO2 100%   BMI 33.37 kg/m  BP Readings from Last 3 Encounters:  04/05/24 137/82  12/12/23 (!) 134/92  07/01/14 100/70   Wt Readings from Last 3 Encounters:  04/05/24 232 lb 9.6 oz (105.5 kg)  12/12/23 238 lb (108 kg)  07/01/14 197 lb (89.4 kg)        Physical Exam  Rectal exam: negative without mass, lesions or tenderness, external hemorrhoids noted.   No results found for any visits on 04/05/24.  Assessment & Plan     Problem List Items Addressed This Visit   None Visit Diagnoses        BRBPR (bright red blood per rectum)    -  Primary   Relevant Orders   Ambulatory referral to Gastroenterology     Esophageal dysphagia            Assessment & Plan Rectal bleeding Intermittent rectal bleeding over the past three weeks with bright red blood in the stool. No associated pain or discomfort. No evidence of external hemorrhoids, fissures, or abnormal growths upon examination. Family history of colon cancer. Differential diagnosis includes internal hemorrhoids and diverticulitis. Emphasized the importance of ruling out colon cancer given family history. - Refer to gastroenterology for further evaluation and potential colonoscopy. - Instruct to follow up if no contact from gastroenterology within ten business days.  Dyspepsia with intermittent upper esophageal discomfort Intermittent upper esophageal discomfort, particularly postprandial, with sensation of food retention in the esophagus. Symptoms improve with occasional use of Prilosec. No current need for daily medication. - Continue Prilosec as needed for symptom relief.     No follow-ups on file.       Rockie Agent, MD  [facility] [phone] (phone) [phone] (fax)  [facility]

## 2024-05-14 ENCOUNTER — Encounter: Payer: Self-pay | Admitting: Family Medicine

## 2024-05-21 NOTE — Progress Notes (Signed)
 No chief complaint on file.   Subjective  Tony Lynn is a 43 y.o. male who presents for No chief complaint on file. HPI History of Present Illness Tony Lynn is a 43 year old male who presents with chest pain.  He experiences random chest pain, which he associates with anxiety. A previous doctor suggested it might be related to a stomach ulcer, but he is uncertain about this diagnosis. The chest pain lacks a specific pattern and is described as 'random'.  He has no shortness of breath or episodes of syncope.  He has a family history of heart disease, with his father having had a myocardial infarction in his fifties. This family history raises his concern about his own heart health as he approaches a similar age.  He mentions increased stress levels, particularly since his daughter recently went to college, which he feels may be contributing to his symptoms.  Review of Systems  Cardiovascular:  Positive for chest pain.    There is no problem list on file for this patient.   No outpatient medications prior to visit.   No facility-administered medications prior to visit.      Objective  Vitals:   05/21/24 0841  BP: (!) 142/92  Pulse: 95  SpO2: 98%  Weight: (!) 105.7 kg (233 lb)  Height: 177.8 cm (5' 10)  PainSc: 0-No pain   Body mass index is 33.43 kg/m.  Home Vitals:     Physical Exam Physical Exam CHEST: Lungs clear to auscultation bilaterally.  Constitutional: alert, in NAD, and communicates well Eye exam: pupils equal and reactive, extraocular eye movements intact. Neck: supple, no thyroid enlargement or cervical adenopathy, and no bruits heard Respiratory: clear to auscultation, without rales or wheezes  Cardiovascular: regular rate and rhythm and without murmurs, rubs or gallops Lower extremities: no lower extremity edema Skin ankles/feet: warm, good capillary refill and no ulcerations or lesions noted Neurological: sensorimotor grossly intact and  normal muscle tone  Results DIAGNOSTIC EKG: Abnormalities present, not significantly concerning     Assessment/Plan:   Assessment & Plan Chest pain with abnormal electrocardiogram Intermittent chest pain with non-specific EKG abnormalities. Family history of heart disease, including father's myocardial infarction in his fifties. EKG changes suggest potential blockage, though not definitive. Differential includes cardiac ischemia versus anxiety-related symptoms. - Schedule stress echocardiogram to evaluate for cardiac ischemia. - Provide instructions for stress test, including wearing appropriate footwear and fasting for three hours prior. - Follow up in two weeks to discuss results, sooner if stress test is abnormal. Diagnoses and all orders for this visit:  Chest pain, unspecified type -     ECG 12-lead -     ECG stress test only; Future -     Echo stress test; Future  Abnormal EKG  Elevated blood pressure reading  Family history of cardiovascular disease     Return in about 4 weeks (around 06/18/2024).     Future Appointments     Date/Time Provider Department Center Visit Type   05/31/2024 9:00 AM [facility] CARD US  1 [facility] C ECHO STRESS TEST   06/21/2024 9:15 AM Custovic, Annalee, DO [facility] C FOLLOW UP   09/04/2024 1:30 PM Therisa Bi, MD [facility] C NEW PATIENT       There are no Patient Instructions on file for this visit.  An after visit summary was provided for the patient either in written format (printed) or through [facility].  This note has been created using  automated tools and reviewed for accuracy by [facility] CUSTOVIC.

## 2024-05-25 ENCOUNTER — Encounter: Payer: Self-pay | Admitting: Emergency Medicine

## 2024-05-25 ENCOUNTER — Other Ambulatory Visit: Payer: Self-pay

## 2024-05-25 ENCOUNTER — Inpatient Hospital Stay
Admission: EM | Admit: 2024-05-25 | Discharge: 2024-05-26 | DRG: 321 | Disposition: A | Discharge status: Home / Self Care | Attending: Internal Medicine | Admitting: Internal Medicine

## 2024-05-25 ENCOUNTER — Telehealth (HOSPITAL_COMMUNITY): Payer: Self-pay | Admitting: Pharmacy Technician

## 2024-05-25 ENCOUNTER — Encounter
Admission: EM | Disposition: A | Discharge status: Home / Self Care | Payer: Self-pay | Source: Home / Self Care | Attending: Internal Medicine

## 2024-05-25 ENCOUNTER — Inpatient Hospital Stay (HOSPITAL_COMMUNITY)
Admit: 2024-05-25 | Discharge: 2024-05-25 | Disposition: A | Discharge status: Home / Self Care | Attending: Internal Medicine | Admitting: Internal Medicine

## 2024-05-25 ENCOUNTER — Other Ambulatory Visit (HOSPITAL_COMMUNITY): Payer: Self-pay

## 2024-05-25 DIAGNOSIS — I2109 ST elevation (STEMI) myocardial infarction involving other coronary artery of anterior wall: Principal | ICD-10-CM | POA: Diagnosis present

## 2024-05-25 DIAGNOSIS — Z888 Allergy status to other drugs, medicaments and biological substances status: Secondary | ICD-10-CM

## 2024-05-25 DIAGNOSIS — I255 Ischemic cardiomyopathy: Secondary | ICD-10-CM | POA: Diagnosis present

## 2024-05-25 DIAGNOSIS — F17210 Nicotine dependence, cigarettes, uncomplicated: Secondary | ICD-10-CM | POA: Diagnosis present

## 2024-05-25 DIAGNOSIS — Z79899 Other long term (current) drug therapy: Secondary | ICD-10-CM

## 2024-05-25 DIAGNOSIS — Z818 Family history of other mental and behavioral disorders: Secondary | ICD-10-CM

## 2024-05-25 DIAGNOSIS — E669 Obesity, unspecified: Secondary | ICD-10-CM | POA: Diagnosis present

## 2024-05-25 DIAGNOSIS — E785 Hyperlipidemia, unspecified: Secondary | ICD-10-CM | POA: Diagnosis not present

## 2024-05-25 DIAGNOSIS — Z91128 Patient's intentional underdosing of medication regimen for other reason: Secondary | ICD-10-CM | POA: Diagnosis not present

## 2024-05-25 DIAGNOSIS — I110 Hypertensive heart disease with heart failure: Secondary | ICD-10-CM | POA: Diagnosis present

## 2024-05-25 DIAGNOSIS — E1169 Type 2 diabetes mellitus with other specified complication: Secondary | ICD-10-CM

## 2024-05-25 DIAGNOSIS — I2102 ST elevation (STEMI) myocardial infarction involving left anterior descending coronary artery: Secondary | ICD-10-CM

## 2024-05-25 DIAGNOSIS — Z833 Family history of diabetes mellitus: Secondary | ICD-10-CM

## 2024-05-25 DIAGNOSIS — Z8261 Family history of arthritis: Secondary | ICD-10-CM | POA: Diagnosis not present

## 2024-05-25 DIAGNOSIS — I5021 Acute systolic (congestive) heart failure: Secondary | ICD-10-CM | POA: Diagnosis present

## 2024-05-25 DIAGNOSIS — J9601 Acute respiratory failure with hypoxia: Secondary | ICD-10-CM | POA: Diagnosis present

## 2024-05-25 DIAGNOSIS — I2510 Atherosclerotic heart disease of native coronary artery without angina pectoris: Secondary | ICD-10-CM | POA: Diagnosis not present

## 2024-05-25 DIAGNOSIS — Z8269 Family history of other diseases of the musculoskeletal system and connective tissue: Secondary | ICD-10-CM | POA: Diagnosis not present

## 2024-05-25 DIAGNOSIS — Z8249 Family history of ischemic heart disease and other diseases of the circulatory system: Secondary | ICD-10-CM | POA: Diagnosis not present

## 2024-05-25 DIAGNOSIS — T466X6A Underdosing of antihyperlipidemic and antiarteriosclerotic drugs, initial encounter: Secondary | ICD-10-CM | POA: Diagnosis present

## 2024-05-25 DIAGNOSIS — K219 Gastro-esophageal reflux disease without esophagitis: Secondary | ICD-10-CM | POA: Diagnosis present

## 2024-05-25 DIAGNOSIS — Z716 Tobacco abuse counseling: Secondary | ICD-10-CM

## 2024-05-25 DIAGNOSIS — Z6832 Body mass index (BMI) 32.0-32.9, adult: Secondary | ICD-10-CM

## 2024-05-25 DIAGNOSIS — I213 ST elevation (STEMI) myocardial infarction of unspecified site: Secondary | ICD-10-CM | POA: Diagnosis present

## 2024-05-25 HISTORY — DX: Type 2 diabetes mellitus without complications: E11.9

## 2024-05-25 HISTORY — PX: CORONARY STENT INTERVENTION: CATH118234

## 2024-05-25 HISTORY — PX: CORONARY/GRAFT ACUTE MI REVASCULARIZATION: CATH118305

## 2024-05-25 HISTORY — PX: LEFT HEART CATH AND CORONARY ANGIOGRAPHY: CATH118249

## 2024-05-25 HISTORY — DX: Hyperlipidemia, unspecified: E78.5

## 2024-05-25 LAB — POCT ACTIVATED CLOTTING TIME
Activated Clotting Time: 256 s
Activated Clotting Time: 262 s
Activated Clotting Time: 326 s

## 2024-05-25 LAB — CBC WITH DIFFERENTIAL/PLATELET
Abs Immature Granulocytes: 0.04 K/uL (ref 0.00–0.07)
Basophils Absolute: 0.1 K/uL (ref 0.0–0.1)
Basophils Relative: 1 %
Eosinophils Absolute: 0.1 K/uL (ref 0.0–0.5)
Eosinophils Relative: 1 %
HCT: 48 % (ref 39.0–52.0)
Hemoglobin: 17.2 g/dL — ABNORMAL HIGH (ref 13.0–17.0)
Immature Granulocytes: 0 %
Lymphocytes Relative: 21 %
Lymphs Abs: 2.8 K/uL (ref 0.7–4.0)
MCH: 32.7 pg (ref 26.0–34.0)
MCHC: 35.8 g/dL (ref 30.0–36.0)
MCV: 91.3 fL (ref 80.0–100.0)
Monocytes Absolute: 0.7 K/uL (ref 0.1–1.0)
Monocytes Relative: 6 %
Neutro Abs: 9.4 K/uL — ABNORMAL HIGH (ref 1.7–7.7)
Neutrophils Relative %: 71 %
Platelets: 207 K/uL (ref 150–400)
RBC: 5.26 MIL/uL (ref 4.22–5.81)
RDW: 11.8 % (ref 11.5–15.5)
WBC: 13.2 K/uL — ABNORMAL HIGH (ref 4.0–10.5)
nRBC: 0 % (ref 0.0–0.2)

## 2024-05-25 LAB — LIPID PANEL
Cholesterol: 258 mg/dL — ABNORMAL HIGH (ref 0–200)
HDL: 42 mg/dL (ref >40)
LDL Cholesterol: 157 mg/dL — ABNORMAL HIGH (ref 0–99)
Total CHOL/HDL Ratio: 6.1 ratio
Triglycerides: 297 mg/dL — ABNORMAL HIGH (ref <150)
VLDL: 59 mg/dL — ABNORMAL HIGH (ref 0–40)

## 2024-05-25 LAB — MRSA NEXT GEN BY PCR, NASAL: MRSA by PCR Next Gen: NOT DETECTED

## 2024-05-25 LAB — COMPREHENSIVE METABOLIC PANEL WITH GFR
ALT: 31 U/L (ref 0–44)
AST: 26 U/L (ref 15–41)
Albumin: 4.3 g/dL (ref 3.5–5.0)
Alkaline Phosphatase: 57 U/L (ref 38–126)
Anion gap: 11 (ref 5–15)
BUN: 22 mg/dL — ABNORMAL HIGH (ref 6–20)
CO2: 27 mmol/L (ref 22–32)
Calcium: 9.3 mg/dL (ref 8.9–10.3)
Chloride: 103 mmol/L (ref 98–111)
Creatinine, Ser: 1.04 mg/dL (ref 0.61–1.24)
GFR, Estimated: >60 mL/min (ref >60)
Glucose, Bld: 166 mg/dL — ABNORMAL HIGH (ref 70–99)
Potassium: 4.2 mmol/L (ref 3.5–5.1)
Sodium: 141 mmol/L (ref 135–145)
Total Bilirubin: 1.6 mg/dL — ABNORMAL HIGH (ref 0.0–1.2)
Total Protein: 7.8 g/dL (ref 6.5–8.1)

## 2024-05-25 LAB — LACTIC ACID, PLASMA: Lactic Acid, Venous: 1.8 mmol/L (ref 0.5–1.9)

## 2024-05-25 LAB — GLUCOSE, CAPILLARY: Glucose-Capillary: 141 mg/dL — ABNORMAL HIGH (ref 70–99)

## 2024-05-25 LAB — HEMOGLOBIN A1C
Hgb A1c MFr Bld: 6 % — ABNORMAL HIGH (ref 4.8–5.6)
Mean Plasma Glucose: 125.5 mg/dL

## 2024-05-25 LAB — PROTIME-INR
INR: 1 (ref 0.8–1.2)
Prothrombin Time: 13.7 s (ref 11.4–15.2)

## 2024-05-25 LAB — TROPONIN I (HIGH SENSITIVITY)
Troponin I (High Sensitivity): 271 ng/L (ref <18)
Troponin I (High Sensitivity): >24000 ng/L (ref <18)

## 2024-05-25 LAB — APTT: aPTT: 32 s (ref 24–36)

## 2024-05-25 SURGERY — CORONARY/GRAFT ACUTE MI REVASCULARIZATION
Anesthesia: Moderate Sedation

## 2024-05-25 MED ORDER — VERAPAMIL HCL 2.5 MG/ML IV SOLN
INTRAVENOUS | Status: AC
  Filled 2024-05-25: qty 2

## 2024-05-25 MED ORDER — SODIUM CHLORIDE 0.9 % IV SOLN: INTRAVENOUS | Status: DC

## 2024-05-25 MED ORDER — CHLORHEXIDINE GLUCONATE CLOTH 2 % EX PADS
6.0000 | MEDICATED_PAD | Freq: Every day | CUTANEOUS | Status: DC
  Administered 2024-05-25 – 2024-05-26 (×2): 6 via TOPICAL

## 2024-05-25 MED ORDER — ONDANSETRON HCL 4 MG/2ML IJ SOLN
INTRAMUSCULAR | Status: AC
Start: 2024-05-25 — End: 2024-05-25
  Filled 2024-05-25: qty 2

## 2024-05-25 MED ORDER — NITROGLYCERIN 1 MG/10 ML FOR IR/CATH LAB
INTRA_ARTERIAL | Status: AC
Start: 2024-05-25 — End: 2024-05-25
  Filled 2024-05-25: qty 10

## 2024-05-25 MED ORDER — PERFLUTREN LIPID MICROSPHERE
1.0000 mL | INTRAVENOUS | Status: AC | PRN
  Administered 2024-05-25: 3 mL via INTRAVENOUS

## 2024-05-25 MED ORDER — HEPARIN SODIUM (PORCINE) 1000 UNIT/ML IJ SOLN
INTRAMUSCULAR | Status: DC | PRN
  Administered 2024-05-25: 6000 [IU] via INTRAVENOUS
  Administered 2024-05-25 (×2): 3000 [IU] via INTRAVENOUS

## 2024-05-25 MED ORDER — CANGRELOR TETRASODIUM 50 MG IV SOLR
INTRAVENOUS | Status: AC
  Filled 2024-05-25: qty 50

## 2024-05-25 MED ORDER — SODIUM CHLORIDE 0.9 % IV SOLN
INTRAVENOUS | Status: AC | PRN
  Administered 2024-05-25: 50 mL/h via INTRAVENOUS

## 2024-05-25 MED ORDER — FREE WATER: 250.0000 mL | Freq: Once | Status: DC

## 2024-05-25 MED ORDER — ASPIRIN 81 MG PO CHEW
324.0000 mg | CHEWABLE_TABLET | Freq: Once | ORAL | Status: AC
  Administered 2024-05-25: 324 mg via ORAL
  Filled 2024-05-25: qty 4

## 2024-05-25 MED ORDER — NITROGLYCERIN 0.4 MG SL SUBL
0.4000 mg | SUBLINGUAL_TABLET | SUBLINGUAL | Status: DC | PRN
  Administered 2024-05-25 (×3): 0.4 mg via SUBLINGUAL
  Filled 2024-05-25: qty 1

## 2024-05-25 MED ORDER — SODIUM CHLORIDE 0.9% FLUSH
3.0000 mL | Freq: Two times a day (BID) | INTRAVENOUS | Status: DC
  Administered 2024-05-25 – 2024-05-26 (×3): 3 mL via INTRAVENOUS

## 2024-05-25 MED ORDER — ONDANSETRON HCL 4 MG/2ML IJ SOLN
INTRAMUSCULAR | Status: AC
  Administered 2024-05-25: 4 mg via INTRAVENOUS
  Filled 2024-05-25: qty 2

## 2024-05-25 MED ORDER — NITROGLYCERIN 1 MG/10 ML FOR IR/CATH LAB
INTRA_ARTERIAL | Status: DC | PRN
  Administered 2024-05-25 (×2): 200 ug

## 2024-05-25 MED ORDER — TICAGRELOR 90 MG PO TABS
ORAL_TABLET | ORAL | Status: DC | PRN
  Administered 2024-05-25: 180 mg via ORAL

## 2024-05-25 MED ORDER — MIDAZOLAM HCL 2 MG/2ML IJ SOLN
INTRAMUSCULAR | Status: AC
  Filled 2024-05-25: qty 2

## 2024-05-25 MED ORDER — IOHEXOL 300 MG/ML  SOLN
INTRAMUSCULAR | Status: DC | PRN
  Administered 2024-05-25: 240 mL

## 2024-05-25 MED ORDER — SODIUM CHLORIDE 0.9 % IV SOLN: 250.0000 mL | INTRAVENOUS | Status: DC | PRN

## 2024-05-25 MED ORDER — FENTANYL CITRATE (PF) 100 MCG/2ML IJ SOLN
INTRAMUSCULAR | Status: DC | PRN
  Administered 2024-05-25 (×2): 25 ug via INTRAVENOUS

## 2024-05-25 MED ORDER — ONDANSETRON HCL 4 MG/2ML IJ SOLN: 4.0000 mg | Freq: Once | INTRAMUSCULAR | Status: AC

## 2024-05-25 MED ORDER — ASPIRIN 81 MG PO CHEW
81.0000 mg | CHEWABLE_TABLET | Freq: Every day | ORAL | Status: DC
Start: 2024-05-26 — End: 2024-05-26
  Filled 2024-05-25: qty 1

## 2024-05-25 MED ORDER — FENTANYL CITRATE (PF) 100 MCG/2ML IJ SOLN
INTRAMUSCULAR | Status: AC
  Filled 2024-05-25: qty 2

## 2024-05-25 MED ORDER — HEPARIN (PORCINE) IN NACL 1000-0.9 UT/500ML-% IV SOLN
INTRAVENOUS | Status: AC
  Filled 2024-05-25: qty 1000

## 2024-05-25 MED ORDER — HEPARIN SODIUM (PORCINE) 1000 UNIT/ML IJ SOLN
INTRAMUSCULAR | Status: AC
  Filled 2024-05-25: qty 10

## 2024-05-25 MED ORDER — LOSARTAN POTASSIUM 25 MG PO TABS
12.5000 mg | ORAL_TABLET | Freq: Every day | ORAL | Status: DC
  Administered 2024-05-25: 12.5 mg via ORAL
  Filled 2024-05-25: qty 0.5

## 2024-05-25 MED ORDER — SODIUM CHLORIDE 0.9 % IV SOLN
4.0000 ug/kg/min | INTRAVENOUS | Status: DC
  Filled 2024-05-25: qty 50

## 2024-05-25 MED ORDER — HEPARIN (PORCINE) IN NACL 2000-0.9 UNIT/L-% IV SOLN
INTRAVENOUS | Status: DC | PRN
  Administered 2024-05-25: 1000 mL

## 2024-05-25 MED ORDER — HEPARIN SODIUM (PORCINE) 5000 UNIT/ML IJ SOLN
4000.0000 [IU] | Freq: Once | INTRAMUSCULAR | Status: AC
  Administered 2024-05-25: 4000 [IU] via INTRAVENOUS
  Filled 2024-05-25: qty 1

## 2024-05-25 MED ORDER — METOPROLOL SUCCINATE ER 50 MG PO TB24
25.0000 mg | ORAL_TABLET | Freq: Every day | ORAL | Status: DC
  Administered 2024-05-25 – 2024-05-26 (×2): 25 mg via ORAL
  Filled 2024-05-25 (×2): qty 1

## 2024-05-25 MED ORDER — ROSUVASTATIN CALCIUM 10 MG PO TABS
40.0000 mg | ORAL_TABLET | Freq: Every day | ORAL | Status: DC
Start: 2024-05-25 — End: 2024-05-25
  Administered 2024-05-25: 40 mg via ORAL
  Filled 2024-05-25: qty 4

## 2024-05-25 MED ORDER — ENOXAPARIN SODIUM 40 MG/0.4ML IJ SOSY
40.0000 mg | PREFILLED_SYRINGE | INTRAMUSCULAR | Status: DC
  Administered 2024-05-26: 40 mg via SUBCUTANEOUS
  Filled 2024-05-25: qty 0.4

## 2024-05-25 MED ORDER — SODIUM CHLORIDE 0.9 % IV SOLN
INTRAVENOUS | Status: AC | PRN
  Administered 2024-05-25: 4 ug/kg/min via INTRAVENOUS

## 2024-05-25 MED ORDER — LABETALOL HCL 5 MG/ML IV SOLN: 10.0000 mg | INTRAVENOUS | Status: AC | PRN

## 2024-05-25 MED ORDER — LIDOCAINE HCL 1 % IJ SOLN
INTRAMUSCULAR | Status: AC
  Filled 2024-05-25: qty 20

## 2024-05-25 MED ORDER — HEPARIN SODIUM (PORCINE) 1000 UNIT/ML IJ SOLN
INTRAMUSCULAR | Status: AC
Start: 2024-05-25 — End: 2024-05-25
  Filled 2024-05-25: qty 10

## 2024-05-25 MED ORDER — VERAPAMIL HCL 2.5 MG/ML IV SOLN
INTRAVENOUS | Status: DC | PRN
  Administered 2024-05-25 (×2): 2.5 mg via INTRAVENOUS

## 2024-05-25 MED ORDER — ACETAMINOPHEN 325 MG PO TABS
650.0000 mg | ORAL_TABLET | ORAL | Status: DC | PRN
  Administered 2024-05-25 (×2): 650 mg via ORAL
  Filled 2024-05-25 (×2): qty 2

## 2024-05-25 MED ORDER — NICOTINE POLACRILEX 2 MG MT GUM: 2.0000 mg | CHEWING_GUM | OROMUCOSAL | Status: DC | PRN

## 2024-05-25 MED ORDER — ATORVASTATIN CALCIUM 80 MG PO TABS
80.0000 mg | ORAL_TABLET | Freq: Every day | ORAL | Status: DC
  Filled 2024-05-25: qty 1

## 2024-05-25 MED ORDER — NICOTINE 21 MG/24HR TD PT24
21.0000 mg | MEDICATED_PATCH | Freq: Every day | TRANSDERMAL | Status: DC
Start: 2024-05-25 — End: 2024-05-25

## 2024-05-25 MED ORDER — CANGRELOR BOLUS VIA INFUSION
INTRAVENOUS | Status: DC | PRN
  Administered 2024-05-25: 3165 ug via INTRAVENOUS

## 2024-05-25 MED ORDER — METOPROLOL SUCCINATE ER 25 MG PO TB24: 12.5000 mg | ORAL_TABLET | Freq: Every day | ORAL | Status: DC

## 2024-05-25 MED ORDER — TICAGRELOR 90 MG PO TABS
ORAL_TABLET | ORAL | Status: AC
  Filled 2024-05-25: qty 2

## 2024-05-25 MED ORDER — ONDANSETRON HCL 4 MG/2ML IJ SOLN: 4.0000 mg | Freq: Four times a day (QID) | INTRAMUSCULAR | Status: DC | PRN

## 2024-05-25 MED ORDER — ONDANSETRON HCL 4 MG/2ML IJ SOLN
INTRAMUSCULAR | Status: DC | PRN
  Administered 2024-05-25: 4 mg via INTRAVENOUS

## 2024-05-25 MED ORDER — MIDAZOLAM HCL 2 MG/2ML IJ SOLN
INTRAMUSCULAR | Status: DC | PRN
  Administered 2024-05-25: 1 mg via INTRAVENOUS

## 2024-05-25 MED ORDER — LIDOCAINE HCL (PF) 1 % IJ SOLN
INTRAMUSCULAR | Status: DC | PRN
  Administered 2024-05-25: 2 mL

## 2024-05-25 MED ORDER — TICAGRELOR 90 MG PO TABS
90.0000 mg | ORAL_TABLET | Freq: Two times a day (BID) | ORAL | Status: DC
  Administered 2024-05-25 – 2024-05-26 (×2): 90 mg via ORAL
  Filled 2024-05-25 (×2): qty 1

## 2024-05-25 MED ORDER — HYDRALAZINE HCL 20 MG/ML IJ SOLN
10.0000 mg | INTRAMUSCULAR | Status: AC | PRN
  Administered 2024-05-25: 10 mg via INTRAVENOUS
  Filled 2024-05-25: qty 1

## 2024-05-25 MED ORDER — SODIUM CHLORIDE 0.9% FLUSH: 3.0000 mL | INTRAVENOUS | Status: DC | PRN

## 2024-05-25 SURGICAL SUPPLY — 23 items
BALLOON TREK RX 2.5X12 (BALLOONS) IMPLANT
BALLOON ~~LOC~~ TREK NEO RX 3.0X12 (BALLOONS) IMPLANT
BALLOON ~~LOC~~ TREK NEO RX 3.5X15 (BALLOONS) IMPLANT
CATH INFINITI 5FR JL4 (CATHETERS) IMPLANT
CATH INFINITI JR4 5F (CATHETERS) IMPLANT
CATH LAUNCHER 6FR EBU3.5 (CATHETERS) IMPLANT
DEVICE RAD TR BAND REGULAR (VASCULAR PRODUCTS) IMPLANT
DRAPE BRACHIAL (DRAPES) IMPLANT
GLIDESHEATH SLEND SS 6F .021 (SHEATH) IMPLANT
GUIDEWIRE INQWIRE 1.5J.035X260 (WIRE) IMPLANT
KIT ENCORE 26 ADVANTAGE (KITS) IMPLANT
KIT MICROPUNCTURE VSI 5F STIFF (SHEATH) IMPLANT
NDL PERC 18GX7CM (NEEDLE) IMPLANT
NEEDLE PERC 18GX7CM (NEEDLE) IMPLANT
PACK CARDIAC CATH (CUSTOM PROCEDURE TRAY) ×1 IMPLANT
SET ATX-X65L (MISCELLANEOUS) IMPLANT
SHEATH AVANTI 6FR X 11CM (SHEATH) IMPLANT
STATION PROTECTION PRESSURIZED (MISCELLANEOUS) IMPLANT
STENT ONYX FRONTIER 2.75X12 (Permanent Stent) IMPLANT
STENT ONYX FRONTIER 2.75X18 (Permanent Stent) IMPLANT
STENT ONYX FRONTIER 3.0X26 (Permanent Stent) IMPLANT
WIRE GUIDERIGHT .035X150 (WIRE) IMPLANT
WIRE RUNTHROUGH .014X180CM (WIRE) IMPLANT

## 2024-05-25 NOTE — ED Notes (Signed)
 Pt noted to be 88% on RA; pt placed on 3L [place] at this time

## 2024-05-25 NOTE — ED Triage Notes (Signed)
 Pt arrives via POV c/o chest pain and bilateral should and arm pain since 0300 w/ n/v. Denies sob or dizziness. NADN.

## 2024-05-25 NOTE — ED Provider Notes (Signed)
 [facility] Provider Note    Event Date/Time   First MD Initiated Contact with Patient 05/25/24 [phone]     (approximate)   History   Chest Pain   HPI  RODMAN RECUPERO is a 43 y.o. male with history of obesity, diabetes, hyperlipidemia, tobacco use, depression who presents to the emergency department with chest pain that started at 3:30 AM.  States it is a pain that is difficult to describe that his substernal, epigastric and radiates up his chest and into his neck.  He has associated shortness of breath, nausea and vomiting, diaphoresis.  No dizziness.  No fever or cough.  No calf tenderness or calf swelling.  Has been having intermittent pain worse with exertion for the past month.  States this is the first time that he had pain at rest.  He was seen by cardiology at [facility] clinic this week and is scheduled for an outpatient stress test.  His father had a heart attack in his 46s.  He denies prior history of stress test, cardiac catheterization.  He states he was told by his primary care doctor that symptoms could be related to anxiety or stomach ulcer.  He has follow-up with gastroenterology scheduled in December.  States he tried antacids at home without relief of pain.  Hemoglobin A1c in April 2025 was 6.8.  LDL 177, triglycerides 327.   History provided by patient, wife.    Past Medical History:  Diagnosis Date   Depression    Situational - divorce   Hyperlipidemia    Type 2 diabetes mellitus (HCC)     Past Surgical History:  Procedure Laterality Date   VASECTOMY  Jan. 2015    MEDICATIONS:  Prior to Admission medications   Medication Sig Start Date End Date Taking? Authorizing Provider  nicotine  (NICODERM CQ  - DOSED IN MG/24 HOURS) 14 mg/24hr patch Place 1 patch (14 mg total) onto the skin daily. 12/12/23   Simmons-Robinson, Makiera, MD  omeprazole (PRILOSEC OTC) 20 MG tablet Take 20 mg by mouth daily.    [provider]  rosuvastatin   (CRESTOR ) 20 MG tablet Take 1 tablet (20 mg total) by mouth daily. 12/28/23   Sharma Coyer, MD    Physical Exam   Triage Vital Signs: ED Triage Vitals  Encounter Vitals Group     BP 05/25/24 0651 (!) 149/109     Girls Systolic BP Percentile --      Girls Diastolic BP Percentile --      Boys Systolic BP Percentile --      Boys Diastolic BP Percentile --      Pulse Rate 05/25/24 0651 (!) 103     Resp 05/25/24 0651 (!) 21     Temp 05/25/24 0651 97.8 F (36.6 C)     Temp Source 05/25/24 0651 Oral     SpO2 05/25/24 0651 93 %     Weight 05/25/24 0645 232 lb 9.4 oz (105.5 kg)     Height 05/25/24 0645 5' 10 (1.778 m)     Head Circumference --      Peak Flow --      Pain Score 05/25/24 0704 8     Pain Loc --      Pain Education --      Exclude from Growth Chart --     Most recent vital signs: Vitals:   05/25/24 0711 05/25/24 0720  BP:    Pulse: (!) 102   Resp: (!) 25   Temp:  SpO2: (!) 89% 91%    CONSTITUTIONAL: Alert, responds appropriately to questions.  Appears uncomfortable HEAD: Normocephalic, atraumatic EYES: Conjunctivae clear, pupils appear equal, sclera nonicteric ENT: normal nose; moist mucous membranes NECK: Supple, normal ROM CARD: RRR; S1 and S2 appreciated RESP: Normal chest excursion without splinting or tachypnea; breath sounds clear and equal bilaterally; no wheezes, no rhonchi, no rales, no hypoxia or respiratory distress, speaking full sentences ABD/GI: Non-distended; soft, non-tender, no rebound, no guarding, no peritoneal signs BACK: The back appears normal EXT: Normal ROM in all joints; no deformity noted, no edema, equal pulses in all 4 extremities, no calf tenderness or calf swelling SKIN: Normal color for age and race; warm; no rash on exposed skin NEURO: Moves all extremities equally, normal speech PSYCH: The patient's mood and manner are appropriate.   ED Results / Procedures / Treatments   LABS: (all labs ordered are listed,  but only abnormal results are displayed) Labs Reviewed  CBC WITH DIFFERENTIAL/PLATELET - Abnormal; Notable for the following components:      Result Value   WBC 13.2 (*)    Hemoglobin 17.2 (*)    Neutro Abs 9.4 (*)    All other components within normal limits  PROTIME-INR  APTT  HEMOGLOBIN A1C  COMPREHENSIVE METABOLIC PANEL WITH GFR  LIPID PANEL  LACTIC ACID, PLASMA  TROPONIN I (HIGH SENSITIVITY)     EKG:  EKG Interpretation Date/Time:  Friday May 25 2024 06:45:56 EDT Ventricular Rate:  88 PR Interval:  134 QRS Duration:  84 QT Interval:  350 QTC Calculation: 423 R Axis:   44  Text Interpretation:  Critical Test Result: STEMI Normal sinus rhythm Anterior infarct , new Inferolateral injury pattern ** ** ACUTE MI / STEMI ** ** Consider right ventricular involvement in acute inferior infarct Abnormal ECG When compared with ECG of 20-Jul-2011 09:09, Acute Anterior infarct is now Present Confirmed by Neomi Neptune [phone]) on 05/25/2024 7:16:59 AM          EKG Interpretation Date/Time:  Friday May 25 2024 07:05:15 EDT Ventricular Rate:  101 PR Interval:  144 QRS Duration:  87 QT Interval:  350 QTC Calculation: 454 R Axis:   41  Text Interpretation: Sinus tachycardia Extensive anterior infarct, acute (LAD) ST elevation, consider inferior injury >>> Acute MI <<< Confirmed by Neomi Neptune [phone]) on 05/25/2024 7:17:21 AM          RADIOLOGY: My personal review and interpretation of imaging:    I have personally reviewed all radiology reports.   No results found.   PROCEDURES:  Critical Care performed: Yes, see critical care procedure note(s)   CRITICAL CARE Performed by: Neptune Neomi   Total critical care time: 30 minutes  Critical care time was exclusive of separately billable procedures and treating other patients.  Critical care was necessary to treat or prevent imminent or life-threatening deterioration.  Critical care was time spent  personally by me on the following activities: development of treatment plan with patient and/or surrogate as well as nursing, discussions with consultants, evaluation of patient's response to treatment, examination of patient, obtaining history from patient or surrogate, ordering and performing treatments and interventions, ordering and review of laboratory studies, ordering and review of radiographic studies, pulse oximetry and re-evaluation of patient's condition.   SABRA1-3 Lead EKG Interpretation  Performed by: Laruen Risser, Neptune SAILOR, DO Authorized by: Nabil Bubolz, Neptune SAILOR, DO     Interpretation: abnormal     ECG rate:  102   ECG rate assessment: tachycardic  Rhythm: sinus tachycardia     Ectopy: none     Conduction: normal       IMPRESSION / MDM / ASSESSMENT AND PLAN / ED COURSE  I reviewed the triage vital signs and the nursing notes.    Patient here with complaints of chest pain, shortness of breath, nausea, vomiting, diaphoresis.  The patient is on the cardiac monitor to evaluate for evidence of arrhythmia and/or significant heart rate changes.   DIFFERENTIAL DIAGNOSIS (includes but not limited to):   ACS, SCAD, myocarditis, PE, CHF, dissection, gastritis, peptic ulcer disease, H. pylori, GERD   Patient's presentation is most consistent with acute presentation with potential threat to life or bodily function.   PLAN: Patient here for chest pain.  History of hyperlipidemia, tobacco use.  Intermittent symptoms for the past month now constant pain at rest since 3:30 AM.  EKG shows anterior ST elevation MI.  Code STEMI activated immediately.  Will give aspirin , heparin .  Patient does report having bright red blood in his stool a month ago but none since.  No melena.  No prior history of GI bleed.  Not on antiplatelets or anticoagulants.  Will proceed with antiplatelet medications now.  Will send labs.  Will give nitroglycerin  for pain control.   MEDICATIONS GIVEN IN ED: Medications  0.9  %  sodium chloride  infusion (has no administration in time range)  nitroGLYCERIN  (NITROSTAT ) SL tablet 0.4 mg ( Sublingual MAR Hold 05/25/24 0719)  Heparin  (Porcine) in NaCl 2000-0.9 UNIT/L-% SOLN (1,000 mLs  Given 05/25/24 0722)  lidocaine  (PF) (XYLOCAINE ) 1 % injection (2 mLs Other Given 05/25/24 0726)  verapamil  (ISOPTIN ) injection (2.5 mg Intravenous Given 05/25/24 0727)  fentaNYL  (SUBLIMAZE ) injection (25 mcg Intravenous Given 05/25/24 0727)  midazolam  (VERSED ) injection (1 mg Intravenous Given 05/25/24 0727)  aspirin  chewable tablet 324 mg (324 mg Oral Given 05/25/24 0657)  heparin  injection 4,000 Units (4,000 Units Intravenous Given 05/25/24 0655)  ondansetron  (ZOFRAN ) injection 4 mg (4 mg Intravenous Given 05/25/24 0658)     ED COURSE: Pain improved from a 10/10 and is now down to a 5/10 after 2 nitroglycerin  tablets.  Will give third nitroglycerin  tablet.  Dr. Mady with cardiology is at bedside and discussing risk and benefits of cardiac catheterization.  Patient and wife agreeable to proceed.  Patient has had some mild hypoxia here.  Placed on nasal cannula.   CONSULTS: Dr. Mady with cardiology consulted.  Appreciate his help.   OUTSIDE RECORDS REVIEWED: Reviewed cardiology note with Dr. Dewane 05/21/24.       FINAL CLINICAL IMPRESSION(S) / ED DIAGNOSES   Final diagnoses:  Acute ST elevation myocardial infarction (STEMI) of anterior wall (HCC)  Acute respiratory failure with hypoxia (HCC)     Rx / DC Orders   ED Discharge Orders     None        Note:  This document was prepared using Dragon voice recognition software and may include unintentional dictation errors.   Joseluis Alessio, Josette SAILOR, DO 05/25/24 [phone]

## 2024-05-25 NOTE — ED Notes (Signed)
 Pt to cath lab at this time by lindsay, RN and Fredderick, RN

## 2024-05-25 NOTE — H&P (Signed)
 History and Physical    Tony Lynn FMW:[id number] DOB: [birth year] DOA: 05/25/2024  PCP: Sharma Coyer, MD (Confirm with patient/family/NH records and if not entered, this has to be entered at [facility] point of entry) Patient coming from: Home  I have personally briefly reviewed patient's old medical records in [facility]  Chief Complaint: Chest pain  HPI: Tony Lynn is a 43 y.o. male with medical history significant of HTN, HLD, cigarette smoking, presented with new onset of chest pain.  Patient woke up this morning with 10/10 pressure-like chest pain radiating to bilateral shoulders with nauseous vomiting of stomach content denies any palpitation no lightheadedness or shortness of breath.  Symptoms started around 3 AM and persistent, no exacerbation or relieving factors, came to ED around 6.  ED Course: Afebrile, tachycardia blood pressure 139/86, EKG showed ST elevation in lead V2 through V6, troponin first at 271, patient was directed to Cath Lab emergently, cath found severe two-vessel disease with occlusion of mid LAD and large OM1, both lesion stented.  Chest pain subsided after the procedure.  Review of Systems: As per HPI otherwise 14 point review of systems negative.    Past Medical History:  Diagnosis Date   Depression    Situational - divorce   Hyperlipidemia    Type 2 diabetes mellitus (HCC)     Past Surgical History:  Procedure Laterality Date   VASECTOMY  Jan. 2015     reports that he has been smoking cigarettes. He has a 15 pack-year smoking history. He has never used smokeless tobacco. He reports current alcohol use of about 8.0 standard drinks of alcohol per week. He reports that he does not use drugs.  Allergies  Allergen Reactions   Nicoderm [Nicotine ] Itching    Patches    Family History  Problem Relation Age of Onset   Arthritis Mother    Lupus Mother    Depression Mother        anxiety/depression   Heart disease Mother     Heart attack Father 67   Hypertension Father    Diabetes Father    Arthritis Maternal Grandfather    Cancer Maternal Grandfather    Diabetes Paternal Grandfather     Prior to Admission medications   Medication Sig Start Date End Date Taking? Authorizing Provider  nicotine  (NICODERM CQ  - DOSED IN MG/24 HOURS) 14 mg/24hr patch Place 1 patch (14 mg total) onto the skin daily. 12/12/23   Simmons-Robinson, Makiera, MD  omeprazole (PRILOSEC OTC) 20 MG tablet Take 20 mg by mouth daily.    [provider]  rosuvastatin  (CRESTOR ) 20 MG tablet Take 1 tablet (20 mg total) by mouth daily. 12/28/23   Sharma Coyer, MD    Physical Exam: Vitals:   05/25/24 0824 05/25/24 0829 05/25/24 0834 05/25/24 0851  BP: 130/88 (!) 118/94 (!) 117/90 125/76  Pulse: 91 93 90 88  Resp: (!) 21 (!) 24 17 15   Temp:    (!) 97.2 F (36.2 C)  TempSrc:    Axillary  SpO2: 98% 95% 97% 94%  Weight:    104.3 kg  Height:        Constitutional: NAD, calm, comfortable Vitals:   05/25/24 0824 05/25/24 0829 05/25/24 0834 05/25/24 0851  BP: 130/88 (!) 118/94 (!) 117/90 125/76  Pulse: 91 93 90 88  Resp: (!) 21 (!) 24 17 15   Temp:    (!) 97.2 F (36.2 C)  TempSrc:    Axillary  SpO2: 98% 95% 97% 94%  Weight:    104.3 kg  Height:       Eyes: PERRL, lids and conjunctivae normal ENMT: Mucous membranes are moist. Posterior pharynx clear of any exudate or lesions.Normal dentition.  Neck: normal, supple, no masses, no thyromegaly Respiratory: clear to auscultation bilaterally, no wheezing, no crackles. Normal respiratory effort. No accessory muscle use.  Cardiovascular: Regular rate and rhythm, no murmurs / rubs / gallops. No extremity edema. 2+ pedal pulses. No carotid bruits.  Abdomen: no tenderness, no masses palpated. No hepatosplenomegaly. Bowel sounds positive.  Musculoskeletal: no clubbing / cyanosis. No joint deformity upper and lower extremities. Good ROM, no contractures. Normal muscle tone.   Skin: no rashes, lesions, ulcers. No induration Neurologic: CN 2-12 grossly intact. Sensation intact, DTR normal. Strength 5/5 in all 4.  Psychiatric: Normal judgment and insight. Alert and oriented x 3. Normal mood.    Labs on Admission: I have personally reviewed following labs and imaging studies  CBC: Recent Labs  Lab 05/25/24 0649  WBC 13.2*  NEUTROABS 9.4*  HGB 17.2*  HCT 48.0  MCV 91.3  PLT 207   Basic Metabolic Panel: Recent Labs  Lab 05/25/24 0649  NA 141  K 4.2  CL 103  CO2 27  GLUCOSE 166*  BUN 22*  CREATININE 1.04  CALCIUM  9.3   GFR: Estimated Creatinine Clearance: 110.8 mL/min (by C-G formula based on SCr of 1.04 mg/dL). Liver Function Tests: Recent Labs  Lab 05/25/24 0649  AST 26  ALT 31  ALKPHOS 57  BILITOT 1.6*  PROT 7.8  ALBUMIN 4.3   No results for input(s): LIPASE, AMYLASE in the last 168 hours. No results for input(s): AMMONIA in the last 168 hours. Coagulation Profile: Recent Labs  Lab 05/25/24 0649  INR 1.0   Cardiac Enzymes: No results for input(s): CKTOTAL, CKMB, CKMBINDEX, TROPONINI in the last 168 hours. BNP (last 3 results) No results for input(s): PROBNP in the last 8760 hours. HbA1C: No results for input(s): HGBA1C in the last 72 hours. CBG: Recent Labs  Lab 05/25/24 0856  GLUCAP 141*   Lipid Profile: Recent Labs    05/25/24 0649  CHOL 258*  HDL 42  LDLCALC 157*  TRIG 297*  CHOLHDL 6.1   Thyroid Function Tests: No results for input(s): TSH, T4TOTAL, FREET4, T3FREE, THYROIDAB in the last 72 hours. Anemia Panel: No results for input(s): VITAMINB12, FOLATE, FERRITIN, TIBC, IRON, RETICCTPCT in the last 72 hours. Urine analysis: No results found for: COLORURINE, APPEARANCEUR, LABSPEC, PHURINE, GLUCOSEU, HGBUR, BILIRUBINUR, KETONESUR, PROTEINUR, UROBILINOGEN, NITRITE, LEUKOCYTESUR  Radiological Exams on Admission: No results found.  EKG:  Independently reviewed.  Sinus tachycardia, ST elevation in lead V2 through V6  Assessment/Plan Principal Problem:   STEMI (ST elevation myocardial infarction) (HCC) Active Problems:   ST elevation myocardial infarction involving left anterior descending (LAD) coronary artery (HCC)  (please populate well all problems here in Problem List. (For example, if patient is on BP meds at home and you resume or decide to hold them, it is a problem that needs to be her. Same for CAD, COPD, HLD and so on)  ST elevation MI - Status post emergency LHC and stenting of mid LAD and large OM1 - ACS medication as per cardiology, which include aspirin , Brilinta , losartan  - Statin - Echo to follow  HTN - Continue losartan   HLD - Continue Crestor  40 mg daily  Cigarette smoking - Cessation education performed at bedside - Nicotine  gum  DVT prophylaxis: Lovenox  Code Status: Full code Family Communication: Wife at  bedside Disposition Plan: Patient is sick with STEMI requiring emergency LHC, expect more than 2 midnight hospital stay Consults called: Cardiology Admission status: ICU admit   Cort ONEIDA Mana MD [facility] Pager [phone]  05/25/2024, 10:04 AM

## 2024-05-25 NOTE — Telephone Encounter (Signed)
 Pharmacy Patient Advocate Encounter  Insurance verification completed.    The patient is insured through [facility]. Patient has [employer], may use a copay card, and/or apply for patient assistance if available.    Ran test claim for Ticagrelor  90mg  and the current 30 day co-pay is $16.00.   This test claim was processed through [facility]- copay amounts may vary at other pharmacies due to pharmacy/plan contracts, or as the patient moves through the different stages of their insurance plan.

## 2024-05-25 NOTE — ED Notes (Signed)
 EKG given to Ward, DO; stemi called at this time, pt moved to room, put on cardiac monitor and pads.

## 2024-05-25 NOTE — Progress Notes (Addendum)
 9148 Patient admitted to ICU via bed from Cath Lab. Patient anxious but stable. Right TR band in place. 9099-8669 Air removed from TR band slowly. Area very wet and oozing bright red blood.  1400 Dr. Mady in to assess right radial site. Site cleaned per Dr. Mady. Pressure to area held by Dr. Mady then tegaderm applied. 1430 Right radial site remains clean and dry. Report given to Harlene RN to observe patient in room #1 while nurse is in Nuclear Med. with  patient from room # 2.

## 2024-05-25 NOTE — Brief Op Note (Signed)
 BRIEF CARDIAC CATHETERIZATION NOTE  05/25/2024  8:42 AM  PATIENT:  Tony Lynn  43 y.o. male  PRE-OPERATIVE DIAGNOSIS:  STEMI  POST-OPERATIVE DIAGNOSIS:  Same  PROCEDURE:  Procedure(s): Coronary/Graft Acute MI Revascularization (N/A) LEFT HEART CATH AND CORONARY ANGIOGRAPHY (N/A)  SURGEON:  Surgeons and Role:    * Saben Donigan, MD - Primary  FINDINGS: Severe two-vessel coronary artery disease with occlusions of mid LAD and large OM1. Moderately reduced LVEF with mildly elevated filling pressure. Successful PCI to mid LAD with overlapping Onyx Frontier 3.0 x 26 mm and 2.75 x 12 mm DES with 0% residual stenosis and TIMI-3 flow. Successful PCI to OM1 using Onyx Frontier 2.75 x 18 mm DES with 0% residual stenosis and TIMI-3 flow.  RECOMMENDATIONS: Continue cangrelor  infusion for 2 hours. DAPT with ASA and ticagrelor  for 12 months. Aggressive secondary prevention of CAD.  Lonni Hanson, MD [facility]

## 2024-05-25 NOTE — ED Notes (Signed)
 Called stemi  was called spoke with [facility]

## 2024-05-25 NOTE — ED Notes (Signed)
 This RN with [facility] gave report to cath lab.

## 2024-05-25 NOTE — Consult Note (Signed)
 Cardiology Consultation   Patient ID: Tony Lynn MRN: [id number]; DOB: Nov 26, 1980  Admit date: 05/25/2024 Date of Consult: 05/25/2024  PCP:  Sharma Coyer, MD   [facility] Providers Cardiologist: Custovic  Patient Profile: Tony Lynn is a 43 y.o. male with a hx of with history of recently diagnosed hyperlipidemia and type 2 diabetes mellitus, who is being seen 05/25/2024 for the evaluation of chest pain and abnormal EKG at the request of Dr. Neomi.  History of Present Illness: Tony Lynn reports having intermittent chest pain for the last few weeks, more pronounced with exertion recently.  He thought this was due to GERD and had recently started omeprazole.  He had also been started on a statin this summer but self discontinued this because he developed some intermittent rectal bleeding that he thought was due to the statin.  He has not had any further rectal bleeding for almost a month.  He was evaluated by Dr. Lindie Pringle this Monday.  EKG is documented as having nonspecific abnormalities.  He was scheduled for a stress echocardiogram that has yet to be performed.  Around 330 this morning, Tony Lynn awoke with severe chest pain that he describes as a dull ache in the center of the chest radiating across the precordium and to his upper back.  He notes associated nausea.  He took additional omeprazole without any relief and ultimately came to the ED.  He was noted to have anterior ST elevation here, prompting activation of the STEMI team.  He was also given aspirin , sublingual nitroglycerin , and heparin  with improvement in his chest pain from 10/10 to 5/10.  Labs obtained in April were notable for a hemoglobin A1c of 6.8, though he is not currently on therapy for DM.  Lipids were also poorly controlled with total cholesterol 285, triglycerides 327, and LDL 177.  Tony Lynn notes that his father had an MI in his 49s and ultimately required CABG.   Past  Medical History:  Diagnosis Date   Depression    Situational - divorce   Hyperlipidemia    Type 2 diabetes mellitus (HCC)     Past Surgical History:  Procedure Laterality Date   VASECTOMY  Jan. 2015     Scheduled Meds:  [START ON 05/26/2024] aspirin   81 mg Oral Daily   Chlorhexidine  Gluconate Cloth  6 each Topical Q0600   losartan   12.5 mg Oral QHS   rosuvastatin   40 mg Oral Daily   ticagrelor   90 mg Oral BID   Continuous Infusions:  sodium chloride      cangrelor  (KENGREAL ) 50 mg in sodium chloride  0.9 % 250 mL (0.2 mg/mL) infusion     PRN Meds: nicotine  polacrilex, nitroGLYCERIN   Allergies:    Allergies  Allergen Reactions   Nicoderm [Nicotine ] Itching    Patches    Social History:   Social History   Tobacco Use   Smoking status: Every Day    Current packs/day: 1.00    Average packs/day: 1 pack/day for 15.0 years (15.0 ttl pk-yrs)    Types: Cigarettes   Smokeless tobacco: Never   Tobacco comments:    Has cut back to 1PPD currently, reports previously up to 2PPD or 1.5PPD   Substance Use Topics   Alcohol use: Yes    Alcohol/week: 8.0 standard drinks of alcohol    Types: 8 Standard drinks or equivalent per week    Comment: 6-8 drinks weekly   Drug use: No    Family History:   Family  History  Problem Relation Age of Onset   Arthritis Mother    Lupus Mother    Depression Mother        anxiety/depression   Heart disease Mother    Heart attack Father 4   Hypertension Father    Diabetes Father    Arthritis Maternal Grandfather    Cancer Maternal Grandfather    Diabetes Paternal Grandfather      ROS:  Please see the history of present illness.  All other ROS reviewed and negative.     Physical Exam/Data: Vitals:   05/25/24 0824 05/25/24 0829 05/25/24 0834 05/25/24 0851  BP: 130/88 (!) 118/94 (!) 117/90 125/76  Pulse: 91 93 90 88  Resp: (!) 21 (!) 24 17 15   Temp:    (!) 97.2 F (36.2 C)  TempSrc:    Axillary  SpO2: 98% 95% 97% 94%  Weight:     104.3 kg  Height:       No intake or output data in the 24 hours ending 05/25/24 1031    05/25/2024    8:51 AM 05/25/2024    6:45 AM 04/05/2024    1:53 PM  Last 3 Weights  Weight (lbs) 229 lb 15 oz 232 lb 9.4 oz 232 lb 9.6 oz  Weight (kg) 104.3 kg 105.5 kg 105.507 kg     Body mass index is 32.99 kg/m.  General: Anxious-appearing man, lying on stretcher in the ED.  His wife is at the bedside. HEENT: normal Neck: no JVD Vascular: No carotid bruits; Distal pulses 2+ bilaterally Cardiac: Tachycardic but regular without murmurs, rubs, or gallops. Lungs: Mildly diminished breath sounds throughout without wheezes or crackles. Abd: soft, nontender, no hepatomegaly  Ext: no edema Musculoskeletal:  No deformities, BUE and BLE strength normal and equal Skin: warm and dry  Neuro:  CNs 2-12 intact, no focal abnormalities noted Psych: Somewhat anxious.  EKG:  The EKG was personally reviewed and demonstrates:  Normal sinus rhythm with anterior and inferolateral ST elevation. Telemetry:  Telemetry was personally reviewed and demonstrates: Normal sinus rhythm and sinus tachycardia.  Relevant CV Studies: LHC/PCI: Multivessel CAD with thrombotic occlusions of the mid LAD and OM1.  There is also 80-90% stenosis in large diagonal branch.  LVEF moderately reduced with mildly elevated LVEDP.  Successful PCI to mid LAD with overlapping 3.0 x 26 mm and 2.75 x 12 mm DES as well as successful PCI to OM1 using 2.75 x 18 mm DES.  Laboratory Data: High Sensitivity Troponin:   Recent Labs  Lab 05/25/24 0649  TROPONINIHS 271*     Chemistry Recent Labs  Lab 05/25/24 0649  NA 141  K 4.2  CL 103  CO2 27  GLUCOSE 166*  BUN 22*  CREATININE 1.04  CALCIUM  9.3  GFRNONAA >60  ANIONGAP 11    Recent Labs  Lab 05/25/24 0649  PROT 7.8  ALBUMIN 4.3  AST 26  ALT 31  ALKPHOS 57  BILITOT 1.6*   Lipids  Recent Labs  Lab 05/25/24 0649  CHOL 258*  TRIG 297*  HDL 42  LDLCALC 157*  CHOLHDL 6.1     Hematology Recent Labs  Lab 05/25/24 0649  WBC 13.2*  RBC 5.26  HGB 17.2*  HCT 48.0  MCV 91.3  MCH 32.7  MCHC 35.8  RDW 11.8  PLT 207   Thyroid No results for input(s): TSH, FREET4 in the last 168 hours.  BNPNo results for input(s): BNP, PROBNP in the last 168 hours.  DDimer No results  for input(s): DDIMER in the last 168 hours.  Radiology/Studies:  No results found.   Assessment and Plan: STEMI: Patient presents with sudden onset of chest pain at 3:30 AM and was noted to have anterior and inferolateral ST elevation.  He was taken for emergent catheterization and PCI's to mid LAD and OM1 with subsequent resolution of chest pain. - Continue cangrelor  infusion for 2 hours after ticagrelor  load at the [place] of the case. - Dual antiplatelet therapy with aspirin  and ticagrelor  for at least 12 months. - High intensity statin; will add atorvastatin  80 mg daily. - Follow-up echocardiogram.  Acute heart failure with mildly reduced ejection fraction and ischemic cardiomyopathy: LVEDP mildly elevated with mildly-moderately reduced LVEF by left ventriculogram. - Obtain echocardiogram. - Maintain net even to slightly negative fluid balance. - Initiate metoprolol  succinate 12.5 mg daily.  Escalate GDMT as blood pressure and renal function allow.  Hyperlipidemia associated with type 2 diabetes mellitus: Patient with concerned that rosuvastatin  may have caused rectal bleeding in the past. - Initiate atorvastatin  80 mg daily. - Ongoing management of DM per hospitalist team.  Disposition: - As the patient is already established with Dr. Dewane, I have asked her to assume the patient's cardiology care for this admission.  Code status: - The patient confirms full code status.  Risk Assessment/Risk Scores:    TIMI Risk Score for ST  Elevation MI:   The patient's TIMI risk score is 4, which indicates a 7.3% risk of all cause mortality at 30 days.   [place]  Heart Association  (NYHA) Functional Class NYHA Class I  For questions or updates, please contact [facility] Please consult [email or web address] for contact info under [facility].  Signed, Lonni Hanson, MD  05/25/2024 10:31 AM

## 2024-05-25 NOTE — ED Notes (Signed)
 Called CCMD spoke with [facility] to place pt on cardiac monitor.

## 2024-05-25 NOTE — Progress Notes (Signed)
   05/25/24 0700  Spiritual Encounters  Type of Visit Initial  Referral source Clinical staff  Reason for visit Code [place])  OnCall Visit Yes  Interventions  Spiritual Care Interventions Made Compassionate presence  Intervention Outcomes  Outcomes Awareness of support  Spiritual Care Plan  Spiritual Care Issues Still Outstanding No further spiritual care needs at this time (see row info)   Chaplain responded to code stemi.  Patient is cared for by the medical team.  Chaplain provides compassionate presence. Chaplain spiritual support remains available as the need arises.

## 2024-05-25 NOTE — Progress Notes (Signed)
  Echocardiogram 2D Echocardiogram has been performed.  Tony Lynn 05/25/2024, 3:22 PM

## 2024-05-25 NOTE — Progress Notes (Signed)
 [facility] CLINIC CARDIOLOGY PROGRESS NOTE   Patient ID: Tony Lynn MRN: [id number] DOB/AGE: Jul 09, 1981 43 y.o.  Admit date: 05/25/2024 Referring Physician Dr End Primary Physician Tony Lynn, Rockie, MD Primary Cardiologist Dr Tony Lynn (me) Reason for Consultation STEMI - continuity of care  HPI: Tony Lynn is a 43 y.o. male with a past medical history of HTN and HLD who presented to the ED on 05/25/2024 for STEMI. He received two DES, one to LAD and another to OM. He follows with me in office and care has been transferred to [facility].  Interval History:  Patient seen and examined at bedside, resting comfortably. No events since his cath. No bleeding from radial site. He denies chest pain, shortness of breath, palpitations, diaphoresis, syncope. Discussed with patient and family at bedside, discharge tomorrow morning pending no complications overnight.   Review of systems complete and found to be negative unless listed above    Vitals:   05/25/24 1045 05/25/24 1100 05/25/24 1115 05/25/24 1130  BP: (!) 132/102 114/77  128/88  Pulse: 91 84 96 (!) 101  Resp:      Temp:      TempSrc:      SpO2: 97% 98% 95% 96%  Weight:      Height:        No intake or output data in the 24 hours ending 05/25/24 1325   PHYSICAL EXAM General: awake, well nourished, in no acute distress. HEENT: Normocephalic and atraumatic. Neck: No JVD.  Lungs: Normal respiratory effort. Clear bilaterally to auscultation. No wheezes, crackles, rhonchi.  Heart: HRRR. Normal S1 and S2 without gallops or murmurs. Radial & DP pulses 2+ bilaterally. Abdomen: Non-distended appearing.  Msk: Normal strength and tone for age. Extremities: No clubbing, cyanosis or edema.   Neuro: Alert and oriented X 3. Psych: Mood appropriate, affect congruent.    LABS: Basic Metabolic Panel: Recent Labs    05/25/24 0649  NA 141  K 4.2  CL 103  CO2 27  GLUCOSE 166*  BUN 22*  CREATININE 1.04  CALCIUM  9.3    Liver Function Tests: Recent Labs    05/25/24 0649  AST 26  ALT 31  ALKPHOS 57  BILITOT 1.6*  PROT 7.8  ALBUMIN 4.3   No results for input(s): LIPASE, AMYLASE in the last 72 hours. CBC: Recent Labs    05/25/24 0649  WBC 13.2*  NEUTROABS 9.4*  HGB 17.2*  HCT 48.0  MCV 91.3  PLT 207   Cardiac Enzymes: Recent Labs    05/25/24 0649 05/25/24 0933  TROPONINIHS 271* >24,000*   BNP: No results for input(s): BNP in the last 72 hours. D-Dimer: No results for input(s): DDIMER in the last 72 hours. Hemoglobin A1C: No results for input(s): HGBA1C in the last 72 hours. Fasting Lipid Panel: Recent Labs    05/25/24 0649  CHOL 258*  HDL 42  LDLCALC 157*  TRIG 297*  CHOLHDL 6.1   Thyroid Function Tests: No results for input(s): TSH, T4TOTAL, T3FREE, THYROIDAB in the last 72 hours.  Invalid input(s): FREET3 Anemia Panel: No results for input(s): VITAMINB12, FOLATE, FERRITIN, TIBC, IRON, RETICCTPCT in the last 72 hours.  No results found.   ECHO pending  TELEMETRY reviewed by me 05/25/24: NSR, ST   ASSESSMENT AND PLAN:  Principal Problem:   STEMI (ST elevation myocardial infarction) (HCC) Active Problems:   ST elevation myocardial infarction involving left anterior descending (LAD) coronary artery (HCC)   S/P STEMI s/p DES to LAD and OM Continue ASA and  Brillinta uninterrupted for 1 year. Crestor  40 mg started. Losartan  12.5 mg and Toprol  XL 25 mg added. D/C on current med regiment Patient will follow up in office with me next week. Stable for discharge 05/26/2024 in the morning.  Tony Lineback, DO 05/25/2024, 1:25 PM [facility]

## 2024-05-25 NOTE — ED Notes (Signed)
 Dr. Oneda notified per Sherron

## 2024-05-25 NOTE — ED Notes (Signed)
 Cardiologist at bedside.

## 2024-05-26 LAB — CBC
HCT: 46.8 % (ref 39.0–52.0)
Hemoglobin: 16.5 g/dL (ref 13.0–17.0)
MCH: 32.3 pg (ref 26.0–34.0)
MCHC: 35.3 g/dL (ref 30.0–36.0)
MCV: 91.6 fL (ref 80.0–100.0)
Platelets: 195 K/uL (ref 150–400)
RBC: 5.11 MIL/uL (ref 4.22–5.81)
RDW: 11.9 % (ref 11.5–15.5)
WBC: 17.6 K/uL — ABNORMAL HIGH (ref 4.0–10.5)
nRBC: 0 % (ref 0.0–0.2)

## 2024-05-26 LAB — ECHOCARDIOGRAM COMPLETE
Est EF: 50
Height: 70 in
S' Lateral: 3.7 cm
Single Plane A4C EF: 42 %
Weight: 3679.04 [oz_av]

## 2024-05-26 LAB — BASIC METABOLIC PANEL WITH GFR
Anion gap: 15 (ref 5–15)
BUN: 15 mg/dL (ref 6–20)
CO2: 22 mmol/L (ref 22–32)
Calcium: 9.2 mg/dL (ref 8.9–10.3)
Chloride: 103 mmol/L (ref 98–111)
Creatinine, Ser: 0.82 mg/dL (ref 0.61–1.24)
GFR, Estimated: >60 mL/min (ref >60)
Glucose, Bld: 122 mg/dL — ABNORMAL HIGH (ref 70–99)
Potassium: 3.9 mmol/L (ref 3.5–5.1)
Sodium: 140 mmol/L (ref 135–145)

## 2024-05-26 MED ORDER — METOPROLOL SUCCINATE ER 25 MG PO TB24: 25.0000 mg | ORAL_TABLET | Freq: Every day | ORAL | 0 refills | Status: DC

## 2024-05-26 MED ORDER — ATORVASTATIN CALCIUM 80 MG PO TABS: 80.0000 mg | ORAL_TABLET | Freq: Every day | ORAL | 0 refills | Status: AC

## 2024-05-26 MED ORDER — LOSARTAN POTASSIUM 25 MG PO TABS: 12.5000 mg | ORAL_TABLET | Freq: Every day | ORAL | 0 refills | Status: DC

## 2024-05-26 MED ORDER — ASPIRIN 81 MG PO CHEW: 81.0000 mg | CHEWABLE_TABLET | Freq: Every day | ORAL | Status: AC

## 2024-05-26 MED ORDER — TICAGRELOR 90 MG PO TABS: 90.0000 mg | ORAL_TABLET | Freq: Two times a day (BID) | ORAL | 0 refills | Status: AC

## 2024-05-26 NOTE — Discharge Summary (Signed)
 Physician Discharge Summary  Tony Lynn FMW:[id number] DOB: 1980/10/03 DOA: 05/25/2024  PCP: Sharma Coyer, MD  Admit date: 05/25/2024 Discharge date: 05/26/2024  Admitted From: Home Disposition:  Home  Recommendations for Outpatient Follow-up:  Follow up with PCP in 1-2 weeks Follow-up cardiology 1 week  Home Health: No Equipment/Devices: None  Discharge Condition: Stable CODE STATUS: Full Diet recommendation: Heart healthy  Brief/Interim Summary: 43 y.o. male with medical history significant of HTN, HLD, cigarette smoking, presented with new onset of chest pain.   Patient woke up this morning with 10/10 pressure-like chest pain radiating to bilateral shoulders with nauseous vomiting of stomach content denies any palpitation no lightheadedness or shortness of breath.  Symptoms started around 3 AM and persistent, no exacerbation or relieving factors, came to ED around 6.   ED Course: Afebrile, tachycardia blood pressure 139/86, EKG showed ST elevation in lead V2 through V6, troponin first at 271, patient was directed to Cath Lab emergently, cath found severe two-vessel disease with occlusion of mid LAD and large OM1, both lesion stented.  Chest pain subsided after the procedure.  Seen and examined on post-cath day 1.  Chest pain-free.  Discussed with cardiology.  Cleared for discharge home.  DAPT, beta-blocker, ACE inhibitor prescribed.  Advised on smoking cessation.  Follow-up outpatient cardiology 1 week.    Discharge Diagnoses:  Principal Problem:   STEMI (ST elevation myocardial infarction) (HCC) Active Problems:   ST elevation myocardial infarction involving left anterior descending (LAD) coronary artery (HCC)  ST elevation MI - Status post emergency LHC and stenting of mid LAD and large OM1 Plan: Cleared for discharge home.  Discussed with cardiology.  Mildly reduced ejection fraction 50%.  Patient was notified via phone.  At time of discharge recommend  DAPT aspirin  and Brilinta .  ACE inhibitor, beta-blocker prescribed.  High intensity statin.  Follow-up outpatient cardiology 1 week  Cigarette smoking Advised on cessation  Discharge Instructions  Discharge Instructions     AMB Referral to Cardiac Rehabilitation - Phase II   Complete by: As directed    Diagnosis:  Coronary Stents STEMI     After initial evaluation and assessments completed: Virtual Based Care may be provided alone or in conjunction with Phase 2 Cardiac Rehab based on patient barriers.: Yes   Intensive Cardiac Rehabilitation (ICR) [facility] location only OR Traditional Cardiac Rehabilitation (TCR) *If criteria for ICR are not met will enroll in TCR [facility] only): Yes   Diet - low sodium heart healthy   Complete by: As directed    Increase activity slowly   Complete by: As directed       Allergies as of 05/26/2024       Reactions   Nicoderm [nicotine ] Itching   Patches        Medication List     STOP taking these medications    rosuvastatin  20 MG tablet Commonly known as: Crestor        TAKE these medications    aspirin  81 MG chewable tablet Chew 1 tablet (81 mg total) by mouth daily.   atorvastatin  80 MG tablet Commonly known as: LIPITOR  Take 1 tablet (80 mg total) by mouth daily.   losartan  25 MG tablet Commonly known as: COZAAR  Take 0.5 tablets (12.5 mg total) by mouth at bedtime.   metoprolol  succinate 25 MG 24 hr tablet Commonly known as: TOPROL -XL Take 1 tablet (25 mg total) by mouth daily. Take with or immediately following a meal.   nicotine  14 mg/24hr patch Commonly known as: NICODERM  CQ - dosed in mg/24 hours Place 1 patch (14 mg total) onto the skin daily.   omeprazole 20 MG tablet Commonly known as: PRILOSEC OTC Take 20 mg by mouth daily.   ticagrelor  90 MG Tabs tablet Commonly known as: BRILINTA  Take 1 tablet (90 mg total) by mouth 2 (two) times daily.        Follow-up Information     Simmons-Robinson, Makiera, MD. Schedule  an appointment as soon as possible for a visit in 1 week(s).   Specialty: Family Medicine Contact information: [address] [place] [address] [phone]         [name], DO. Schedule an appointment as soon as possible for a visit in 1 week(s).   Specialty: Cardiology Contact information: [address] [place] [address] [phone]                Allergies  Allergen Reactions   Nicoderm [Nicotine ] Itching    Patches    Consultations: Cardiology   Procedures/Studies: ECHOCARDIOGRAM COMPLETE Result Date: 05/26/2024    ECHOCARDIOGRAM REPORT   Patient Name:   Tony Lynn Date of Exam: 05/25/2024 Medical Rec #:  [id number]         Height:       70.0 in Accession #:    [id number]        Weight:       229.9 lb Date of Birth:  1981/08/10         BSA:          2.215 m Patient Age:    43 years          BP:           145/104 mmHg Patient Gender: M                 HR:           109 bpm. Exam Location:  [facility] Procedure: 2D Echo (Both Spectral and Color Flow Doppler were utilized during            procedure). Indications:     myocardial infarction  History:         Patient has no prior history of Echocardiogram examinations.                  Risk Factors:Current Smoker.  Sonographer:     Tinnie Barefoot RDCS Referring Phys:  [phone] CHRISTOPHER END Diagnosing Phys: [name] IMPRESSIONS  1. Left ventricular ejection fraction, by estimation, is 50%. The left ventricle has normal function. The left ventricle demonstrates regional wall motion abnormalities (see scoring diagram/findings for description). Left ventricular diastolic parameters were normal.  2. Right ventricular systolic function is normal. The right ventricular size is normal.  3. The mitral valve is normal in structure. No evidence of mitral valve regurgitation. No evidence of mitral stenosis.  4. The aortic valve is normal in structure. Aortic valve regurgitation is not  visualized. No aortic stenosis is present.  5. The inferior vena cava is normal in size with greater than 50% respiratory variability, suggesting right atrial pressure of 3 mmHg. FINDINGS  Left Ventricle: Left ventricular ejection fraction, by estimation, is 50%. The left ventricle has normal function. The left ventricle demonstrates regional wall motion abnormalities. Definity  contrast agent was given IV to delineate the left ventricular  endocardial borders. The left ventricular internal cavity size was normal in size. There is no left ventricular hypertrophy. Left ventricular diastolic parameters were normal.  LV Wall  Scoring: The mid anteroseptal segment and mid inferoseptal segment are hypokinetic. Right Ventricle: The right ventricular size is normal. No increase in right ventricular wall thickness. Right ventricular systolic function is normal. Left Atrium: Left atrial size was normal in size. Right Atrium: Right atrial size was normal in size. Pericardium: There is no evidence of pericardial effusion. Mitral Valve: The mitral valve is normal in structure. No evidence of mitral valve regurgitation. No evidence of mitral valve stenosis. Tricuspid Valve: The tricuspid valve is normal in structure. Tricuspid valve regurgitation is not demonstrated. Aortic Valve: The aortic valve is normal in structure. Aortic valve regurgitation is not visualized. No aortic stenosis is present. Pulmonic Valve: The pulmonic valve was normal in structure. Pulmonic valve regurgitation is not visualized. Aorta: The aortic root is normal in size and structure. Venous: The inferior vena cava is normal in size with greater than 50% respiratory variability, suggesting right atrial pressure of 3 mmHg. IAS/Shunts: No atrial level shunt detected by color flow Doppler.  LEFT VENTRICLE PLAX 2D LVIDd:         5.30 cm      Diastology LVIDs:         3.70 cm      LV e' medial:  13.60 cm/s LV PW:         1.00 cm      LV e' lateral: 10.60 cm/s LV  IVS:        1.10 cm LVOT diam:     2.00 cm LV SV:         57 LV SV Index:   26 LVOT Area:     3.14 cm  LV Volumes (MOD) LV vol d, MOD A4C: 121.0 ml LV vol s, MOD A4C: 70.2 ml LV SV MOD A4C:     121.0 ml RIGHT VENTRICLE             IVC RV Basal diam:  2.50 cm     IVC diam: 1.90 cm RV S prime:     14.50 cm/s TAPSE (M-mode): 1.8 cm LEFT ATRIUM             Index        RIGHT ATRIUM           Index LA diam:        3.40 cm 1.54 cm/m   RA Area:     11.30 cm LA Vol (A2C):   44.1 ml 19.91 ml/m  RA Volume:   22.50 ml  10.16 ml/m LA Vol (A4C):   52.8 ml 23.84 ml/m LA Biplane Vol: 50.8 ml 22.94 ml/m  AORTIC VALVE LVOT Vmax:   114.00 cm/s LVOT Vmean:  73.300 cm/s LVOT VTI:    0.183 m  AORTA Ao Root diam: 3.00 cm Ao Asc diam:  2.80 cm  SHUNTS Systemic VTI:  0.18 m Systemic Diam: 2.00 cm Annalee Custovic Electronically signed by Annalee Casa Signature Date/Time: 05/26/2024/9:53:03 AM    Final    CARDIAC CATHETERIZATION Result Date: 05/25/2024 Conclusions: Severe two-vessel coronary artery disease with occlusions of mid LAD and large OM1. Moderately reduced left ventricular systolic function (LVEF 40-45%) with mildly elevated filling pressure (LVEDP 23 mmHg). Successful PCI to mid LAD with overlapping Onyx Frontier 3.0 x 26 mm and 2.75 x 12 mm drug-eluting stents with 0% residual stenosis and TIMI-3 flow. Successful PCI to OM1 using Onyx Frontier 2.75 x 18 mm drug-eluting stent with 0% residual stenosis and TIMI-3 flow.  Recommendations: Continue cangrelor  infusion for 2 hours. Dual antiplatelet  therapy with aspirin  and ticagrelor  for 12 months. Aggressive secondary prevention of coronary artery disease Obtain echocardiogram. Lonni Hanson, MD [facility]     Subjective: Seen and examined on the day of discharge.  Stable no distress.  Appropriate discharge home.  Discharge Exam: Vitals:   05/26/24 1000 05/26/24 1100  BP: 110/72 104/72  Pulse: (!) 105 (!) 119  Resp: 12 18  Temp:    SpO2: 97% 95%    Vitals:   05/26/24 0900 05/26/24 0957 05/26/24 1000 05/26/24 1100  BP:  105/79 110/72 104/72  Pulse: (!) 110 (!) 106 (!) 105 (!) 119  Resp: 16  12 18   Temp:      TempSrc:      SpO2: 95%  97% 95%  Weight:      Height:        General: Pt is alert, awake, not in acute distress Cardiovascular: RRR, S1/S2 +, no rubs, no gallops Respiratory: CTA bilaterally, no wheezing, no rhonchi Abdominal: Soft, NT, ND, bowel sounds + Extremities: no edema, no cyanosis    The results of significant diagnostics from this hospitalization (including imaging, microbiology, ancillary and laboratory) are listed below for reference.     Microbiology: Recent Results (from the past 240 hours)  MRSA Next Gen by PCR, Nasal     Status: None   Collection Time: 05/25/24  8:55 AM   Specimen: Nasal Mucosa; Nasal Swab  Result Value Ref Range Status   MRSA by PCR Next Gen NOT DETECTED NOT DETECTED Final    Comment: (NOTE) The GeneXpert MRSA Assay (FDA approved for NASAL specimens only), is one component of a comprehensive MRSA colonization surveillance program. It is not intended to diagnose MRSA infection nor to guide or monitor treatment for MRSA infections. Test performance is not FDA approved in patients less than 86 years old. Performed at [facility], [address] [place], [place] [address]      Labs: BNP (last 3 results) No results for input(s): BNP in the last 8760 hours. Basic Metabolic Panel: Recent Labs  Lab 05/25/24 0649 05/26/24 0419  NA 141 140  K 4.2 3.9  CL 103 103  CO2 27 22  GLUCOSE 166* 122*  BUN 22* 15  CREATININE 1.04 0.82  CALCIUM  9.3 9.2   Liver Function Tests: Recent Labs  Lab 05/25/24 0649  AST 26  ALT 31  ALKPHOS 57  BILITOT 1.6*  PROT 7.8  ALBUMIN 4.3   No results for input(s): LIPASE, AMYLASE in the last 168 hours. No results for input(s): AMMONIA in the last 168 hours. CBC: Recent Labs  Lab 05/25/24 0649 05/26/24 0419  WBC  13.2* 17.6*  NEUTROABS 9.4*  --   HGB 17.2* 16.5  HCT 48.0 46.8  MCV 91.3 91.6  PLT 207 195   Cardiac Enzymes: No results for input(s): CKTOTAL, CKMB, CKMBINDEX, TROPONINI in the last 168 hours. BNP: Invalid input(s): POCBNP CBG: Recent Labs  Lab 05/25/24 0856  GLUCAP 141*   D-Dimer No results for input(s): DDIMER in the last 72 hours. Hgb A1c Recent Labs    05/25/24 0649  HGBA1C 6.0*   Lipid Profile Recent Labs    05/25/24 0649  CHOL 258*  HDL 42  LDLCALC 157*  TRIG 297*  CHOLHDL 6.1   Thyroid function studies No results for input(s): TSH, T4TOTAL, T3FREE, THYROIDAB in the last 72 hours.  Invalid input(s): FREET3 Anemia work up No results for input(s): VITAMINB12, FOLATE, FERRITIN, TIBC, IRON, RETICCTPCT in the last 72 hours.  Urinalysis No results found for: COLORURINE, APPEARANCEUR, LABSPEC, PHURINE, GLUCOSEU, HGBUR, BILIRUBINUR, KETONESUR, PROTEINUR, UROBILINOGEN, NITRITE, LEUKOCYTESUR Sepsis Labs Recent Labs  Lab 05/25/24 0649 05/26/24 0419  WBC 13.2* 17.6*   Microbiology Recent Results (from the past 240 hours)  MRSA Next Gen by PCR, Nasal     Status: None   Collection Time: 05/25/24  8:55 AM   Specimen: Nasal Mucosa; Nasal Swab  Result Value Ref Range Status   MRSA by PCR Next Gen NOT DETECTED NOT DETECTED Final    Comment: (NOTE) The GeneXpert MRSA Assay (FDA approved for NASAL specimens only), is one component of a comprehensive MRSA colonization surveillance program. It is not intended to diagnose MRSA infection nor to guide or monitor treatment for MRSA infections. Test performance is not FDA approved in patients less than 28 years old. Performed at [facility], [address] [place], [place] [address]      Time coordinating discharge: 40 minutes  SIGNED:   Calvin KATHEE Robson, MD  [facility] 05/26/2024, 3:01 PM Pager   If 7PM-7AM, please contact  night-coverage

## 2024-05-26 NOTE — Plan of Care (Signed)
  Problem: Education: Goal: Knowledge of General Education information will improve Description: Including pain rating scale, medication(s)/side effects and non-pharmacologic comfort measures Outcome: Progressing   Problem: Health Behavior/Discharge Planning: Goal: Ability to manage health-related needs will improve Outcome: Progressing   Problem: Clinical Measurements: Goal: Cardiovascular complication will be avoided Outcome: Progressing   Problem: Pain Managment: Goal: General experience of comfort will improve and/or be controlled Outcome: Progressing   Problem: Safety: Goal: Ability to remain free from injury will improve Outcome: Progressing

## 2024-05-26 NOTE — Progress Notes (Signed)
 0800 Awake and alert. Up in room. 1030 Walked in hall x 2. 1155 Discharge teaching done. Questions answered. 1208 Discharged home with wife.

## 2024-05-28 ENCOUNTER — Telehealth: Payer: Self-pay

## 2024-05-28 NOTE — Telephone Encounter (Signed)
 Transition Care Management Unsuccessful Follow-up Telephone Call  Date of discharge and from where:  05/26/24  [facility]  Attempts:  1st Attempt  Reason for unsuccessful TCM follow-up call:  Left voice message  Charmaine Bloodgood, LPN [facility] Health Advisor [place] [facility] You Are. We Are. [facility] Direct Dial [phone]

## 2024-05-29 ENCOUNTER — Telehealth: Payer: Self-pay

## 2024-05-29 LAB — LIPOPROTEIN A (LPA): Lipoprotein (a): 115 nmol/L — ABNORMAL HIGH (ref <75.0)

## 2024-05-29 NOTE — Transitions of Care (Post Inpatient/ED Visit) (Unsigned)
   05/29/2024  Name: Tony Lynn MRN: [id number] DOB: Apr 23, 1981  Today's TOC FU Call Status: Today's TOC FU Call Status:: Unsuccessful Call (2nd Attempt) Unsuccessful Call (2nd Attempt) Date: 05/29/24  Attempted to reach the patient regarding the most recent Inpatient/ED visit.  Follow Up Plan: Additional outreach attempts will be made to reach the patient to complete the Transitions of Care (Post Inpatient/ED visit) call.   Signature Julian Lemmings, LPN [facility] Nurse Health Advisor Direct Dial [phone]

## 2024-05-30 NOTE — Transitions of Care (Post Inpatient/ED Visit) (Signed)
   05/30/2024  Name: Tony Lynn MRN: [id number] DOB: Jan 10, 1981  Today's TOC FU Call Status: Today's TOC FU Call Status:: Unsuccessful Call (2nd Attempt) Unsuccessful Call (2nd Attempt) Date: 05/30/24  Attempted to reach the patient regarding the most recent Inpatient/ED visit.  Follow Up Plan: No further outreach attempts will be made at this time. We have been unable to contact the patient.  Signature Julian Lemmings, LPN [facility] Nurse Health Advisor Direct Dial [phone]

## 2024-05-30 NOTE — Transitions of Care (Post Inpatient/ED Visit) (Signed)
   05/30/2024  Name: KUSHAL SAUNDERS MRN: [id number] DOB: Feb 11, 1981  Today's TOC FU Call Status: Today's TOC FU Call Status:: Unsuccessful Call (2nd Attempt) Unsuccessful Call (2nd Attempt) Date: 05/30/24  Attempted to reach the patient regarding the most recent Inpatient/ED visit.  Follow Up Plan: Additional outreach attempts will be made to reach the patient to complete the Transitions of Care (Post Inpatient/ED visit) call.   Signature Julian Lemmings, LPN [facility] Nurse Health Advisor Direct Dial [phone]

## 2024-06-01 ENCOUNTER — Ambulatory Visit: Attending: Student | Admitting: Student

## 2024-06-01 ENCOUNTER — Encounter: Payer: Self-pay | Admitting: Student

## 2024-06-01 VITALS — BP 95/60 | HR 95 | Ht 70.0 in | Wt 232.8 lb

## 2024-06-01 DIAGNOSIS — Z720 Tobacco use: Secondary | ICD-10-CM | POA: Diagnosis not present

## 2024-06-01 DIAGNOSIS — Z79899 Other long term (current) drug therapy: Secondary | ICD-10-CM

## 2024-06-01 DIAGNOSIS — I10 Essential (primary) hypertension: Secondary | ICD-10-CM

## 2024-06-01 DIAGNOSIS — E785 Hyperlipidemia, unspecified: Secondary | ICD-10-CM

## 2024-06-01 DIAGNOSIS — I2510 Atherosclerotic heart disease of native coronary artery without angina pectoris: Secondary | ICD-10-CM

## 2024-06-01 MED ORDER — NITROGLYCERIN 0.4 MG SL SUBL: 0.4000 mg | SUBLINGUAL_TABLET | SUBLINGUAL | 3 refills | Status: AC | PRN

## 2024-06-01 NOTE — Progress Notes (Signed)
 Cardiology Clinic Note   Date: 06/01/2024 ID: CASSIEL FERNANDEZ, DOB [birth year], MRN [id number]  Primary Cardiologist:  Lonni Hanson, MD  Chief Complaint   Tony Lynn is a 43 y.o. male who presents to the clinic today for hospital follow up.   Patient Profile   Tony Lynn is followed by Dr. Hanson for the history outlined below.      Past medical history significant for: CAD. 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  PCI with overlapping DES 3.0 x 26 mm and 2.75 x 12 mm to mid LAD, DES 2.75 x 18 mm to OM1.  Moderately reduced LV function EF 40 to 45% with mildly elevated filling pressure. Echo 05/25/2024: EF 50%.  Mid anteroseptal and mid inferior septal are hypokinetic.  Normal diastolic parameters.  Normal RV size/function.  No significant valvular abnormalities. Hypertension. Hyperlipidemia. LPa 05/26/2024: 115. Lipid panel 05/25/2024: LDL 157, HDL 42, TG 297, total 258. Tobacco abuse.  In summary, patient was evaluated by [facility] cardiology on 05/21/2024 for exertional chest pain.  He reported a family history of heart disease with father having MI in his 20s.  Nonspecific EKG abnormalities were noted.  He was scheduled for stress echo.  Patient presented to the ED on 05/25/2024 with chest pain that started a few hours before arrival.  Patient described substernal and epigastric chest pain radiating to his neck with associated shortness of breath, diaphoresis, nausea and vomiting.  He tried antacids without relief.  He reported this was first episode of chest pain at rest.  An EKG was performed and demonstrated ST elevation in V2 through V6.  Initial troponin 271 and peaked at > 24,000.  STEMI initiated.  He was taken emergently to the Cath Lab and underwent PCI with overlapping DES to mid LAD and DES to OM1.  Echo demonstrated normal LV function.  Patient was discharged on 05/26/2024.     History of Present Illness    Today,  patient is accompanied by his wife. He is doing well since hospital discharge. Patient denies shortness of breath, dyspnea on exertion, lower extremity edema, orthopnea or PND. No chest pain, pressure, or tightness. No palpitations.  Patient continues to smoke <1 pack of cigarettes a day. He is interested in quitting. He tried Chantix in the past but had terrible dreams on it. He was not able to tolerate the nicotine  patches as they irritated his skin. He did not like the nicorette  gum. 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 Discussed heart catheterization, stent placement and medications in detail. All questions answered.     ROS: All other systems reviewed and are otherwise negative except as noted in History of Present Illness.  EKGs/Labs Reviewed    EKG Interpretation Date/Time:  Friday June 01 2024 15:43:56 EDT Ventricular Rate:  95 PR Interval:  142 QRS Duration:  86 QT Interval:  316 QTC Calculation: 397 R Axis:   21  Text Interpretation: Normal sinus rhythm Inferior infarct , age undetermined Anterior infarct , age undetermined When compared with ECG of 25-May-2024 07:05, PREVIOUS ECG IS PRESENT Confirmed by Loistine Sober [phone]) on 06/01/2024 3:51:48 PM   05/25/2024: ALT 31; AST 26 05/26/2024: BUN 15; Creatinine, Ser 0.82; Potassium 3.9; Sodium 140   05/26/2024: Hemoglobin 16.5; WBC 17.6   12/12/2023: TSH 0.853    Risk Assessment/Calculations          STOP-Bang Score:  3  Physical Exam    VS:  BP 95/60 (BP Location: Left Arm, Patient Position: Sitting, Cuff Size: Normal)   Pulse 95   Ht 5' 10 (1.778 m)   Wt 232 lb 12.8 oz (105.6 kg)   SpO2 99%   BMI 33.40 kg/m  , BMI Body mass index is 33.4 kg/m.  GEN: Well nourished, well developed, in no acute distress. Neck: No JVD or carotid bruits. Cardiac:  RRR.  No murmur. No rubs or gallops.   Respiratory:  Respirations regular and unlabored. Clear to auscultation without  rales, wheezing or rhonchi. GI: Soft, nontender, nondistended. Extremities: Radials/DP/PT 2+ and equal bilaterally. No clubbing or cyanosis. No edema.  Skin: Warm and dry, no rash. Neuro: Strength intact.  Assessment & Plan   CAD S/p PCI with overlapping DES to mid LAD and DES to OM1 on 05/25/2024 in the setting of STEMI.  Patient denies chest pain. He has random twinges in his abdomen and bilateral chest but no pain or discomfort like he experienced prior to going to the hospital. Right wrist cath site is well healed with mild resolving ecchymosis, no erythema, tenderness or drainage. - Continue aspirin , Brilinta , atorvastatin , Toprol . - Increase physical activity as tolerated.  - Rx prn SL NTG.   Hypertension BP today 95/60. Patient reports he had not been on any medication for BP previously. He does not have a BP cuff at home but is willing to get one. Recommended spot checking BP a few times a week for goal BP <130/80. - Continue Toprol . - Stop losartan .   Hyperlipidemia LPa 115, LDL 157 September 2025, not at goal. - Continue atorvastatin . - Lipid panel and LFTs in 2 months.  Tobacco abuse Patient continues to smoke <1 pack of cigarettes a day. He has tried Chantix, gum, and patches in the past. Discussed strategies for quitting. He is interested in stopping.  - Complete cessation encouraged.  Disposition: Stop losartan . Lipid panel and CMP in 2 months. Return in 3 months or sooner as needed.     Cardiac Rehabilitation Eligibility Assessment  The patient is ready to start cardiac rehabilitation from a cardiac standpoint.        Signed, Barnie HERO. Tony Provence, DNP, NP-C

## 2024-06-01 NOTE — Patient Instructions (Signed)
 Medication Instructions:  Your physician recommends the following medication changes.  STOP TAKING: Losartan   START TAKING: A prescription has been sent in for Nitroglycerin .  If you have chest pain that doesn't relieve quickly, place one tablet under your tongue and allow it to dissolve.  If no relief after 5 minutes, you may take another pill.  If no relief after 5 minutes, you may take a 3rd dose but you need to call 911 and report to ER immediately.    *If you need a refill on your cardiac medications before your next appointment, please call your pharmacy*  Lab Work: No labs ordered today  If you have labs (blood work) drawn today and your tests are completely normal, you will receive your results only by: MyChart Message (if you have MyChart) OR A paper copy in the mail If you have any lab test that is abnormal or we need to change your treatment, we will call you to review the results.  Testing/Procedures: No test ordered today   Follow-Up: At [facility], you and your health needs are our priority.  As part of our continuing mission to provide you with exceptional heart care, our providers are all part of one team.  This team includes your primary Cardiologist (physician) and Advanced Practice Providers or APPs (Physician Assistants and Nurse Practitioners) who all work together to provide you with the care you need, when you need it.  Your next appointment:   3 month(s)  Provider:   You may see Lonni Hanson, MD or one of the following Advanced Practice Providers on your designated Care Team:   Lonni Meager, NP Lesley Maffucci, PA-C Bernardino Bring, PA-C Cadence Sycamore, PA-C Tylene Lunch, NP Barnie Hila, NP    We recommend signing up for the patient portal called MyChart.  Sign up information is provided on this After Visit Summary.  MyChart is used to connect with patients for Virtual Visits (Telemedicine).  Patients are able to view lab/test results,  encounter notes, upcoming appointments, etc.  Non-urgent messages can be sent to your provider as well.   To learn more about what you can do with MyChart, go to [email or web address].

## 2024-08-24 NOTE — Progress Notes (Deleted)
 "  Cardiology Clinic Note   Date: 08/24/2024 ID: BRAXLEY BALANDRAN, DOB 06-Jan-1981, MRN [id number]  Primary Cardiologist:  Lonni Hanson, MD  Chief Complaint   Tony Lynn is a 43 y.o. male who presents to the clinic today for ***  Patient Profile   Tony Lynn is followed by *** for the history outlined below.       Past medical history significant for: CAD. 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  PCI with overlapping DES 3.0 x 26 mm and 2.75 x 12 mm to mid LAD, DES 2.75 x 18 mm to OM1.  Moderately reduced LV function EF 40 to 45% with mildly elevated filling pressure. Echo 05/25/2024: EF 50%.  Mid anteroseptal and mid inferior septal are hypokinetic.  Normal diastolic parameters.  Normal RV size/function.  No significant valvular abnormalities. Hypertension. Hyperlipidemia. LPa 05/26/2024: 115. Lipid panel 05/25/2024: LDL 157, HDL 42, TG 297, total 258. Tobacco abuse.  In summary, patient was evaluated by [facility] cardiology on 05/21/2024 for exertional chest pain.  He reported a family history of heart disease with father having MI in his 5s.  Nonspecific EKG abnormalities were noted.  He was scheduled for stress echo.  Patient presented to the ED on 05/25/2024 with chest pain that started a few hours before arrival.  Patient described substernal and epigastric chest pain radiating to his neck with associated shortness of breath, diaphoresis, nausea and vomiting.  He tried antacids without relief.  He reported this was first episode of chest pain at rest.  An EKG was performed and demonstrated ST elevation in V2 through V6.  Initial troponin 271 and peaked at > 24,000.  STEMI initiated.  He was taken emergently to the Cath Lab and underwent PCI with overlapping DES to mid LAD and DES to OM1.  Echo demonstrated normal LV function.  Patient was discharged on 05/26/2024.   Patient was last seen in the office by me on 06/01/2024 for  hospital follow-up.  He was doing well at that time with no chest pain.  His BP was soft at the time of his visit at 95/60 on intake.  He was instructed to stop losartan .     History of Present Illness    Today, patient ***  CAD S/p PCI with overlapping DES to mid LAD and DES to OM1 on 05/25/2024 in the setting of STEMI.  Patient*** - Continue aspirin , Brilinta , atorvastatin , Toprol , as needed SL NTG. - Increase physical activity as tolerated.     Hypertension BP today*** - Continue Toprol .   Hyperlipidemia LPa 115, LDL 157 September 2025, not at goal. - Continue atorvastatin . - Lipid panel and LFTs in 2 months.***   Tobacco abuse Patient continues to smoke <1 pack of cigarettes a day. He has tried Chantix, gum, and patches in the past. Discussed strategies for quitting. He is interested in stopping.*** - Complete cessation encouraged.  ROS: All other systems reviewed and are otherwise negative except as noted in History of Present Illness.  EKGs/Labs Reviewed        05/25/2024: ALT 31; AST 26 05/26/2024: BUN 15; Creatinine, Ser 0.82; Potassium 3.9; Sodium 140   05/26/2024: Hemoglobin 16.5; WBC 17.6   12/12/2023: TSH 0.853   No results found for requested labs within last 365 days.  ***  Risk Assessment/Calculations    {Does this patient have ATRIAL FIBRILLATION?[phone]} No BP recorded.  {Refresh Note OR Click here to enter BP  :1}***  STOP-Bang Score:  3  { Consider Dx Sleep Disordered Breathing or Sleep Apnea  ICD G47.33          :1}     Physical Exam    VS:  There were no vitals taken for this visit. , BMI There is no height or weight on file to calculate BMI.  GEN: Well nourished, well developed, in no acute distress. Neck: No JVD or carotid bruits. Cardiac: *** RRR. *** No murmur. No rubs or gallops.   Respiratory:  Respirations regular and unlabored. Clear to auscultation without rales, wheezing or rhonchi. GI: Soft, nontender, nondistended. Extremities:  Radials/DP/PT 2+ and equal bilaterally. No clubbing or cyanosis. No edema ***  Skin: Warm and dry, no rash. Neuro: Strength intact.  Assessment & Plan   ***  Disposition: ***  {The patient has an active order for outpatient cardiac rehabilitation.   Please indicate if the patient is ready to start. Do NOT delete this.  It will auto delete.  Refresh note, then sign.              Click here to document readiness and see contraindications.  :1}  Cardiac Rehabilitation Eligibility Assessment      {Are you ordering a CV Procedure (e.g. stress test, cath, DCCV, TEE, etc)?   Press F2        :[id number]}   Signed, Barnie HERO. Joanna Hall, DNP, NP-C  "

## 2024-08-31 ENCOUNTER — Ambulatory Visit: Admitting: Physician Assistant

## 2024-09-03 ENCOUNTER — Ambulatory Visit: Admitting: Student

## 2024-09-04 NOTE — Progress Notes (Unsigned)
 "  Cardiology Clinic Note   Date: 09/04/2024 ID: Ulysees, Robarts [birth year], MRN [id number]  Primary Cardiologist:  Lonni Hanson, MD  Chief Complaint   Tony Lynn is a 43 y.o. male who presents to the clinic today for ***  Patient Profile   Tony Lynn is followed by *** for the history outlined below.       Past medical history significant for: CAD. 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  PCI with overlapping DES 3.0 x 26 mm and 2.75 x 12 mm to mid LAD, DES 2.75 x 18 mm to OM1.  Moderately reduced LV function EF 40 to 45% with mildly elevated filling pressure. Echo 05/25/2024: EF 50%.  Mid anteroseptal and mid inferior septal are hypokinetic.  Normal diastolic parameters.  Normal RV size/function.  No significant valvular abnormalities. Hypertension. Hyperlipidemia. LPa 05/26/2024: 115. Lipid panel 05/25/2024: LDL 157, HDL 42, TG 297, total 258. Tobacco abuse.  In summary, patient was evaluated by [facility] cardiology on 05/21/2024 for exertional chest pain.  He reported a family history of heart disease with father having MI in his 84s.  Nonspecific EKG abnormalities were noted.  He was scheduled for stress echo.  Patient presented to the ED on 05/25/2024 with chest pain that started a few hours before arrival.  Patient described substernal and epigastric chest pain radiating to his neck with associated shortness of breath, diaphoresis, nausea and vomiting.  He tried antacids without relief.  He reported this was first episode of chest pain at rest.  An EKG was performed and demonstrated ST elevation in V2 through V6.  Initial troponin 271 and peaked at > 24,000.  STEMI initiated.  He was taken emergently to the Cath Lab and underwent PCI with overlapping DES to mid LAD and DES to OM1.  Echo demonstrated normal LV function.  Patient was discharged on 05/26/2024.   Patient was last seen in the office by me on 06/01/2024 for  hospital follow-up.  He was doing well at that time with no chest pain.  His BP was soft at the time of his visit at 95/60 on intake.  He was instructed to stop losartan .     History of Present Illness    Today, patient ***  CAD S/p PCI with overlapping DES to mid LAD and DES to OM1 on 05/25/2024 in the setting of STEMI.  Patient*** - Continue aspirin , Brilinta , atorvastatin , Toprol , as needed SL NTG. - Increase physical activity as tolerated.     Hypertension BP today*** - Continue Toprol .   Hyperlipidemia LPa 115, LDL 157 September 2025, not at goal. - Continue atorvastatin . - Lipid panel and LFTs in 2 months.***   Tobacco abuse Patient continues to smoke <1 pack of cigarettes a day. He has tried Chantix, gum, and patches in the past. Discussed strategies for quitting. He is interested in stopping.*** - Complete cessation encouraged.  ROS: All other systems reviewed and are otherwise negative except as noted in History of Present Illness.  EKGs/Labs Reviewed        05/25/2024: ALT 31; AST 26 05/26/2024: BUN 15; Creatinine, Ser 0.82; Potassium 3.9; Sodium 140   05/26/2024: Hemoglobin 16.5; WBC 17.6   12/12/2023: TSH 0.853   No results found for requested labs within last 365 days.  ***  Risk Assessment/Calculations    {Does this patient have ATRIAL FIBRILLATION?:[phone]} No BP recorded.  {Refresh Note OR Click here to enter BP  :1}***  STOP-Bang Score:  3  { Consider Dx Sleep Disordered Breathing or Sleep Apnea  ICD G47.33          :1}     Physical Exam    VS:  There were no vitals taken for this visit. , BMI There is no height or weight on file to calculate BMI.  GEN: Well nourished, well developed, in no acute distress. Neck: No JVD or carotid bruits. Cardiac: *** RRR. *** No murmur. No rubs or gallops.   Respiratory:  Respirations regular and unlabored. Clear to auscultation without rales, wheezing or rhonchi. GI: Soft, nontender, nondistended. Extremities:  Radials/DP/PT 2+ and equal bilaterally. No clubbing or cyanosis. No edema ***  Skin: Warm and dry, no rash. Neuro: Strength intact.  Assessment & Plan   ***  Disposition: ***  {The patient has an active order for outpatient cardiac rehabilitation.   Please indicate if the patient is ready to start. Do NOT delete this.  It will auto delete.  Refresh note, then sign.              Click here to document readiness and see contraindications.  :1}  Cardiac Rehabilitation Eligibility Assessment      {Are you ordering a CV Procedure (e.g. stress test, cath, DCCV, TEE, etc)?   Press F2        :[id number]}   Signed, Barnie HERO. Zacharey Jensen, DNP, NP-C  "

## 2024-09-07 ENCOUNTER — Encounter: Payer: Self-pay | Admitting: Student

## 2024-09-07 ENCOUNTER — Ambulatory Visit: Attending: Student | Admitting: Student

## 2024-09-07 VITALS — BP 138/78 | HR 104 | Ht 70.0 in | Wt 235.2 lb

## 2024-09-07 DIAGNOSIS — Z720 Tobacco use: Secondary | ICD-10-CM | POA: Diagnosis not present

## 2024-09-07 DIAGNOSIS — I10 Essential (primary) hypertension: Secondary | ICD-10-CM

## 2024-09-07 DIAGNOSIS — I2510 Atherosclerotic heart disease of native coronary artery without angina pectoris: Secondary | ICD-10-CM

## 2024-09-07 DIAGNOSIS — R0602 Shortness of breath: Secondary | ICD-10-CM

## 2024-09-07 DIAGNOSIS — E785 Hyperlipidemia, unspecified: Secondary | ICD-10-CM

## 2024-09-07 DIAGNOSIS — Z79899 Other long term (current) drug therapy: Secondary | ICD-10-CM

## 2024-09-07 MED ORDER — METOPROLOL SUCCINATE ER 50 MG PO TB24: 50.0000 mg | ORAL_TABLET | Freq: Every day | ORAL | 3 refills | Status: AC

## 2024-09-07 NOTE — Patient Instructions (Signed)
 Medication Instructions:  Your physician recommends the following medication changes.  INCREASE: Toprol  XL to 50 mg daily   *If you need a refill on your cardiac medications before your next appointment, please call your pharmacy*  Lab Work: Your provider would like for you to return next week to have the following labs drawn: CBC, CMP, Lipid.   Please go to [facility] [address] ([facility] [address], [place] [address] You do not need an appointment.  They are open from 8 am- 4:30 pm.  Lunch from 1:00 pm- 2:00 pm You DO need to be fasting.  If you have labs (blood work) drawn today and your tests are completely normal, you will receive your results only by: MyChart Message (if you have MyChart) OR A paper copy in the mail If you have any lab test that is abnormal or we need to change your treatment, we will call you to review the results.  Testing/Procedures: Your physician has requested that you have an echocardiogram. Echocardiography is a painless test that uses sound waves to create images of your heart. It provides your doctor with information about the size and shape of your heart and how well your hearts chambers and valves are working.   You may receive an ultrasound enhancing agent through an IV if needed to better visualize your heart during the echo. This procedure takes approximately one hour.  There are no restrictions for this procedure.  This will take place at [address] [facility]) [address], [place] [address]  Please note: We ask at that you not bring children with you during ultrasound (echo/ vascular) testing. Due to room size and safety concerns, children are not allowed in the ultrasound rooms during exams. Our front office staff cannot provide observation of children in our lobby area while testing is being conducted. An adult accompanying a patient to their appointment will only be allowed in the ultrasound room at the  discretion of the ultrasound technician under special circumstances. We apologize for any inconvenience.   Follow-Up: At [name], you and your health needs are our priority.  As part of our continuing mission to provide you with exceptional heart care, our providers are all part of one team.  This team includes your primary Cardiologist (physician) and Advanced Practice Providers or APPs (Physician Assistants and Nurse Practitioners) who all work together to provide you with the care you need, when you need it.  Your next appointment:   6 month(s)  Provider:   You may see Lonni Hanson, MD or one of the following Advanced Practice Providers on your designated Care Team:   Barnie Hila, NP   We recommend signing up for the patient portal called MyChart.  Sign up information is provided on this After Visit Summary.  MyChart is used to connect with patients for Virtual Visits (Telemedicine).  Patients are able to view lab/test results, encounter notes, upcoming appointments, etc.  Non-urgent messages can be sent to your provider as well.   To learn more about what you can do with MyChart, go to [email or web address].

## 2024-09-28 ENCOUNTER — Other Ambulatory Visit: Payer: Self-pay | Admitting: *Deleted

## 2024-09-28 DIAGNOSIS — Z79899 Other long term (current) drug therapy: Secondary | ICD-10-CM

## 2024-09-29 LAB — LIPID PANEL
Chol/HDL Ratio: 3.3 ratio (ref 0.0–5.0)
Cholesterol, Total: 155 mg/dL (ref 100–199)
HDL: 47 mg/dL
LDL Chol Calc (NIH): 76 mg/dL (ref 0–99)
Triglycerides: 188 mg/dL — ABNORMAL HIGH (ref 0–149)
VLDL Cholesterol Cal: 32 mg/dL (ref 5–40)

## 2024-09-29 LAB — COMPREHENSIVE METABOLIC PANEL WITH GFR
ALT: 48 [IU]/L — ABNORMAL HIGH (ref 0–44)
AST: 23 [IU]/L (ref 0–40)
Albumin: 4.6 g/dL (ref 4.1–5.1)
Alkaline Phosphatase: 91 [IU]/L (ref 47–123)
BUN/Creatinine Ratio: 14 (ref 9–20)
BUN: 13 mg/dL (ref 6–24)
Bilirubin Total: 1.1 mg/dL (ref 0.0–1.2)
CO2: 24 mmol/L (ref 20–29)
Calcium: 9.7 mg/dL (ref 8.7–10.2)
Chloride: 102 mmol/L (ref 96–106)
Creatinine, Ser: 0.93 mg/dL (ref 0.76–1.27)
Globulin, Total: 2.5 g/dL (ref 1.5–4.5)
Glucose: 138 mg/dL — ABNORMAL HIGH (ref 70–99)
Potassium: 4.3 mmol/L (ref 3.5–5.2)
Sodium: 140 mmol/L (ref 134–144)
Total Protein: 7.1 g/dL (ref 6.0–8.5)
eGFR: 104 mL/min/{1.73_m2}

## 2024-09-29 LAB — CBC
Hematocrit: 50.6 % (ref 37.5–51.0)
Hemoglobin: 16.9 g/dL (ref 13.0–17.7)
MCH: 32.5 pg (ref 26.6–33.0)
MCHC: 33.4 g/dL (ref 31.5–35.7)
MCV: 97 fL (ref 79–97)
Platelets: 217 10*3/uL (ref 150–450)
RBC: 5.2 x10E6/uL (ref 4.14–5.80)
RDW: 12.2 % (ref 11.6–15.4)
WBC: 12.1 10*3/uL — ABNORMAL HIGH (ref 3.4–10.8)

## 2024-10-01 ENCOUNTER — Ambulatory Visit: Payer: Self-pay | Admitting: Student

## 2024-10-03 NOTE — Progress Notes (Signed)
 Last read by Ozell JAYSON Metro at 10:23AM on 10/03/2024.
